# Patient Record
Sex: Male | Born: 1980
Health system: Southern US, Community
[De-identification: ages and names within clinical notes are randomized; demographics above are authoritative.]

## PROBLEM LIST (undated history)

## (undated) DIAGNOSIS — K219 Gastro-esophageal reflux disease without esophagitis: Secondary | ICD-10-CM

## (undated) DIAGNOSIS — IMO0001 Reserved for inherently not codable concepts without codable children: Secondary | ICD-10-CM

---

## 2005-04-04 ENCOUNTER — Emergency Department: Payer: Self-pay | Admitting: Emergency Medicine

## 2005-07-08 ENCOUNTER — Emergency Department: Payer: Self-pay | Admitting: Emergency Medicine

## 2006-03-06 ENCOUNTER — Emergency Department (HOSPITAL_COMMUNITY): Admission: EM | Admit: 2006-03-06 | Discharge: 2006-03-07 | Payer: Self-pay | Admitting: Emergency Medicine

## 2006-05-14 ENCOUNTER — Emergency Department (HOSPITAL_COMMUNITY): Admission: EM | Admit: 2006-05-14 | Discharge: 2006-05-15 | Payer: Self-pay | Admitting: Emergency Medicine

## 2009-09-23 ENCOUNTER — Emergency Department: Payer: Self-pay | Admitting: Emergency Medicine

## 2012-08-22 ENCOUNTER — Emergency Department: Payer: Self-pay | Admitting: Emergency Medicine

## 2012-08-22 LAB — URINALYSIS, COMPLETE
Bilirubin,UR: NEGATIVE
Blood: NEGATIVE
Glucose,UR: NEGATIVE mg/dL (ref 0–75)
Protein: 30
RBC,UR: 15 /HPF (ref 0–5)
Specific Gravity: 1.035 (ref 1.003–1.030)

## 2012-08-22 LAB — CBC
HCT: 47.4 % (ref 40.0–52.0)
MCV: 86 fL (ref 80–100)
Platelet: 152 10*3/uL (ref 150–440)
RBC: 5.53 10*6/uL (ref 4.40–5.90)

## 2012-08-22 LAB — COMPREHENSIVE METABOLIC PANEL
Alkaline Phosphatase: 69 U/L (ref 50–136)
BUN: 15 mg/dL (ref 7–18)
Calcium, Total: 9.6 mg/dL (ref 8.5–10.1)
Chloride: 104 mmol/L (ref 98–107)
Creatinine: 1.19 mg/dL (ref 0.60–1.30)
EGFR (African American): >60
Potassium: 4.6 mmol/L (ref 3.5–5.1)
SGPT (ALT): 17 U/L (ref 12–78)

## 2012-11-15 ENCOUNTER — Emergency Department: Payer: Self-pay | Admitting: Emergency Medicine

## 2012-11-15 LAB — COMPREHENSIVE METABOLIC PANEL
Albumin: 4.3 g/dL (ref 3.4–5.0)
Bilirubin,Total: 0.9 mg/dL (ref 0.2–1.0)
Calcium, Total: 9.6 mg/dL (ref 8.5–10.1)
Chloride: 98 mmol/L (ref 98–107)
Creatinine: 1.13 mg/dL (ref 0.60–1.30)
EGFR (African American): >60
Osmolality: 272 (ref 275–301)
Potassium: 3.5 mmol/L (ref 3.5–5.1)
Total Protein: 8.9 g/dL — ABNORMAL HIGH (ref 6.4–8.2)

## 2012-11-15 LAB — URINALYSIS, COMPLETE
Bilirubin,UR: NEGATIVE
Glucose,UR: NEGATIVE mg/dL (ref 0–75)
Ph: 6 (ref 4.5–8.0)
Protein: 100
Specific Gravity: 1.032 (ref 1.003–1.030)
WBC UR: 2 /HPF (ref 0–5)

## 2012-11-15 LAB — CBC
HCT: 47.8 % (ref 40.0–52.0)
HGB: 16 g/dL (ref 13.0–18.0)
MCH: 28.2 pg (ref 26.0–34.0)
WBC: 10 10*3/uL (ref 3.8–10.6)

## 2013-03-27 ENCOUNTER — Inpatient Hospital Stay: Payer: Self-pay | Admitting: Internal Medicine

## 2013-03-27 LAB — COMPREHENSIVE METABOLIC PANEL
Albumin: 4.1 g/dL (ref 3.4–5.0)
Alkaline Phosphatase: 58 U/L (ref 50–136)
Bilirubin,Total: 0.9 mg/dL (ref 0.2–1.0)
Chloride: 100 mmol/L (ref 98–107)
Co2: 30 mmol/L (ref 21–32)
EGFR (Non-African Amer.): >60
Total Protein: 8.4 g/dL — ABNORMAL HIGH (ref 6.4–8.2)

## 2013-03-27 LAB — CBC
HCT: 47.6 % (ref 40.0–52.0)
MCH: 28.8 pg (ref 26.0–34.0)
MCV: 84 fL (ref 80–100)
WBC: 9.4 10*3/uL (ref 3.8–10.6)

## 2013-03-27 LAB — URINALYSIS, COMPLETE
Bacteria: NONE SEEN
Glucose,UR: NEGATIVE mg/dL (ref 0–75)
Nitrite: NEGATIVE
Squamous Epithelial: NONE SEEN

## 2013-03-28 LAB — CBC WITH DIFFERENTIAL/PLATELET
Basophil #: 0.1 10*3/uL (ref 0.0–0.1)
Basophil %: 0.8 %
Eosinophil #: 0.1 10*3/uL (ref 0.0–0.7)
HGB: 14.8 g/dL (ref 13.0–18.0)
MCH: 28.4 pg (ref 26.0–34.0)
Monocyte #: 0.7 x10 3/mm (ref 0.2–1.0)
Neutrophil %: 55.8 %
Platelet: 117 10*3/uL — ABNORMAL LOW (ref 150–440)
RBC: 5.21 10*6/uL (ref 4.40–5.90)
RDW: 13.3 % (ref 11.5–14.5)
WBC: 6.6 10*3/uL (ref 3.8–10.6)

## 2013-03-28 LAB — BASIC METABOLIC PANEL
Chloride: 107 mmol/L (ref 98–107)
Osmolality: 276 (ref 275–301)

## 2013-03-28 LAB — PROTIME-INR: Prothrombin Time: 15.7 secs — ABNORMAL HIGH (ref 11.5–14.7)

## 2014-12-12 NOTE — Consult Note (Signed)
PATIENT NAME:  Eric Mcfarland, [name] MR#:  [id number] DATE OF BIRTH:  06/14/1981  DATE OF CONSULTATION:  03/28/2013  Dictated for Barnetta ChapelMartin Skulskie, MD  REFERRING PHYSICIAN:  Dr. Sherryll BurgerShah.  I appreciate consult from Dr. Sherryll BurgerShah for this 34 year old African American man admitted for nausea and vomiting for evaluation of the same. Evidently the patient was in his normal state of health, described as good until Tuesday, 2 days ago, when he developed some abdominal discomfort followed by several episodes of vomiting. He states late Tuesday night he vomited a moderate-to-large amount of dark red material. Otherwise, the emesis is yellowish-green. There were some red streaks in it yesterday states. He states has had some upper abdominal fullness  and a decreased appetite. States he had one episode similar to this one New Year's Eve, but associated this with his heavy alcohol use at the time, which he reports he has significantly decreased since then. States rare consumption only at present. I do note some mild thrombocytopenia this visit; this was not present in March of this year. States he has no history of this or liver disease in the past. Liver and spleen appear normal on CT. Denies history of acid reflux symptoms, NSAIDs, abdominal pain, problems swallowing, loss of appetite, melena, loss of weight, hematochezia, further GI complaints.   MEDICATIONS: None.   PAST MEDICAL HISTORY: None.   PAST SURGICAL HISTORY: None.   ALLERGIES: THE PATIENT IS ALLERGIC TO AMOXICILLIN.   SOCIAL HISTORY: Smokes 1 pack of cigarettes daily. Drinks alcohol on an occasional basis since the turn of the year this year, which prior to he does admit that he was a heavy alcohol consumer. Does admit to smoking marijuana 2 to 3 times a day. He states he does not use any other illicit substances. Currently lives with his girlfriend. No history of prior blood transfusion.   FAMILY HISTORY: Significant for hypertension, diabetes. Denies  family history of colorectal cancer, colon polyps, liver disease, ulcers.   REVIEW OF SYSTEMS: 10 systems reviewed. Significant only for fatigue and feeling generally weak. Denies other complaints, and otherwise the systems review is negative.   MOST RECENT LABS: Glucose 123, BUN 11, creatinine 0.98 sodium 138, potassium 3.8, GFR greater than 60, calcium 8.4, lipase 257, total protein 8.4, albumin 4.1, total bilirubin 0.9, ALP 58, AST 18 ALT 16.   WBC 6.6, hemoglobin 14.8, hematocrit 43.8, platelet count 117.   Red cells are normocytic and normochromic, with normal RDW.   CT with IV contrast only with no apparent bowel obstruction or perforation. Was not able to find the appendix. Otherwise not revealing for a significant for abdominal pathology.   PHYSICAL EXAMINATION: VITAL SIGNS: Most recent vital signs: Temperature 98.7, pulse 59, respiratory rate 20, blood pressure 124/80. SAO2 of 99%.  GENERAL: Well-appearing young man resting comfortably in bed, in no acute distress  HEENT: Normocephalic, atraumatic. Sclerae are clear. No redness, drainage, inflammation to the eyes or the nares. No mouth sores.  NECK: Supple. Flexible. No lymphadenopathy, JVD.  CHEST: Respirations eupneic. Lungs CTA.  CARDIOVASCULAR: S1, S2, RRR. No MRG. Peripheral pulses 2+. No appreciable edema.  GASTROINTESTINAL: Flat, soft abdomen. Bowel sounds x 4. Minimal bilateral upper quadrant tenderness. No guarding, rigidity, peritoneal signs, rebound tenderness, hepatosplenomegaly or masses, or other abnormalities appreciated.  SKIN: Warm, dry, pink. No erythema, lesion or rash. No ecchymosis.  NEUROLOGICAL: Alert, oriented x 3. Cranial nerves II-XII grossly intact. Speech clear. No facial droop.  EXTREMITIES: MAE well x 4. Strength five out of  five. Sensation intact.  PSYCH: Pleasant, calm, cooperative.   IMPRESSION AND PLAN: Abdominal discomfort, nausea, vomiting, history of likely hematemesis. Hemoglobin labs are  stable. Agree with PPI therapy. Will plan EGD for luminal evaluation this afternoon.  Discussed the indications, risks, and benefits with him, and he is agreeable. He has also had htis vomiting since he has been here. Due to his thrombocytopenia and the alcohol history we will also obtain PPI and INR prior to the  procedure. He has been counseled by the admitting team already regarding his substance use.  [facility] services were provided by Vevelyn Pat, MSN, NPC in collaboration with Barnetta Chapel, M.D., with whom I have discussed this patient in full.   Thank you for this consult.    ____________________________ Keturah Barre, NP chl:dm D: 03/28/2013 14:25:03 ET T: 03/28/2013 15:07:41 ET JOB#: [id number]  cc: Keturah Barre, NP, <Dictator> Eustaquio Maize Phinehas Grounds FNP ELECTRONICALLY SIGNED 04/19/2013 8:47

## 2014-12-12 NOTE — Consult Note (Signed)
Chief Complaint:  Subjective/Chief Complaint seen for nausea, vomiting,  epigastric pain.  nausea today, no emesis, continues with some epigastric pain.  tolerating small amounts of clears.   VITAL SIGNS/ANCILLARY NOTES: **Vital Signs.:   08-Aug-14 04:30  Vital Signs Type Routine  Temperature Temperature (F) 98.5  Celsius 36.9  Pulse Pulse 73  Respirations Respirations 20  Systolic BP Systolic BP 130  Diastolic BP (mmHg) Diastolic BP (mmHg) 85  Mean BP 100  Pulse Ox % Pulse Ox % 98  Pulse Ox Activity Level  At rest  Oxygen Delivery Room Air/ 21 %   Brief Assessment:  Cardiac Regular   Respiratory clear BS   Gastrointestinal details normal Nondistended  Bowel sounds normal  No rebound tenderness  tender to palpation, mild voluntary guarding, positive epigastric pain   Assessment/Plan:  Assessment/Plan:  Assessment 1) epigastric pain, n/v-EGD showing DU, multiple GU and gastritis. Erosive esophagitis.   2) history of etoh abuse and substance abuse (marijuana)   Plan 1) continue bid ppi, clears today.  awaiting h. pylori serology.   Dr Mechele CollinElliott covering over the weekend.   Electronic Signatures: Barnetta ChapelSkulskie, Martin (MD)  (Signed 08-Aug-14 12:40)  Authored: Chief Complaint, VITAL SIGNS/ANCILLARY NOTES, Brief Assessment, Assessment/Plan   Last Updated: 08-Aug-14 12:40 by Barnetta ChapelSkulskie, Martin (MD)

## 2014-12-12 NOTE — Consult Note (Signed)
Brief Consult Note: Diagnosis: abdominal pain, vomiting.   Patient was seen by consultant.   Consult note dictated.   Discussed with Attending MD.   Comments: Appreciate consult for 34 y/o [place] man admitted with NV for evaluation of the same. Evidentally patient was in his normal states of health, described as good, until Tuesday 2d ago, when he developed some abdominal discomfort followed by several episodes of vomiting. States that late Tuesday nigth he vomited a moderate to large amount of dark red material. Otherwise the emesis has been yellowy-green. There were some red streaks in it yesterday. States he has some upper abdominal soreness and decreased appetite. States he had one episode similar to this over New Years Eve- but associated this with his heavy etoh use at the time, which reports he has significantly decreased since then. States rare consumption only now. Do note some mild thrombocytopenia this visit- that was not present in March of this year. States he has no history of this or liver disease in past. Liver and spleen appear normal on CT.  Denies history of acid reflux symptoms, NSAIDs, abodminal pain, problems swallowing, loss of appetite, melena, hematochezia, and further GI complaints. Impression and plan: Abdominal discomfort, NV, history of likely hematemesis. HGB/labs stable. Agree with PPI therapy. Will plan EGD for luminal evaluation later this afternoon. Discussed the indications/risks/benefits with him and he is agreeable. Due to his thrombocytopenia and etoh history, will also obtain PT/INR prior to procedure. He has been counselled by admitting team regarding marijuana use.  Electronic Signatures: Vevelyn PatLondon, Andersen Iorio H (NP)  (Signed 07-Aug-14 14:14)  Authored: Brief Consult Note   Last Updated: 07-Aug-14 14:14 by Keturah BarreLondon, Hinda Lindor H (NP)

## 2014-12-12 NOTE — Consult Note (Signed)
Chief Complaint:  Subjective/Chief Complaint Please see endoscopy report.  2 gastric ulcers in the antrum, moderate sized duodenal ulcer in the posterior bulb with duodenitis, Gastritis in the cardia, friable, friable esophagitis, likely erosive from multiple bouts of emesis.  Minimal oozing in the cardia, distal esophagus.  No other bleeding.   Continue ppi as you are avoid nsaids, awaiting h. pylori serology, continue npo for now until tomorrow.   following.   VITAL SIGNS/ANCILLARY NOTES: **Vital Signs.:   07-Aug-14 14:09  Vital Signs Type Routine  Temperature Temperature (F) 98.7  Celsius 37  Temperature Source oral  Respirations Respirations 20  Systolic BP Systolic BP 124  Diastolic BP (mmHg) Diastolic BP (mmHg) 80  Mean BP 94  Pulse Ox % Pulse Ox % 99  Pulse Ox Activity Level  At rest  Oxygen Delivery Room Air/ 21 %   Electronic Signatures: Barnetta ChapelSkulskie, Tumeka Chimenti (MD)  (Signed 07-Aug-14 15:10)  Authored: Chief Complaint, VITAL SIGNS/ANCILLARY NOTES   Last Updated: 07-Aug-14 15:10 by Barnetta ChapelSkulskie, Andra Matsuo (MD)

## 2014-12-12 NOTE — Consult Note (Signed)
CC: abd pain.  Pt tol diet OK but wants more food.  VSS afebrile, Plt ct 117.  EGD report reviewed, ulcers and gastritis.  i explained to him that this would be a problem to him as long as he drank alcohol and smoked.  Also told him it takes 8 or more weeks to heal these and not to stop mediciation if he began to feel better after discharge before he finished the full course of treatment.  Electronic Signatures: Scot JunElliott, Kingslee Mairena T (MD)  (Signed on 09-Aug-14 11:49)  Authored  Last Updated: 09-Aug-14 11:49 by Scot JunElliott, Denzel Etienne T (MD)

## 2014-12-12 NOTE — H&P (Signed)
PATIENT NAME:  Eric Mcfarland, Cher L MR#:  [id number] DATE OF BIRTH:  11-Aug-1981  DATE OF ADMISSION:  03/27/2013  PRIMARY CARE PHYSICIAN: None.   REFERRING PHYSICIAN: Dr. Dorothea GlassmanPaul Malinda.   CHIEF COMPLAINT: Nausea, vomiting.   HISTORY OF PRESENT ILLNESS: Mr. Eric Mcfarland is a 34 year old white male with history of tobacco and marijuana use. Had an episode of bloody vomiting about 4 days back. For the last 2 days, has been having multiple episodes of nausea and vomiting with occasional bloody streaks. Concerning this, was unable to keep down any food. Had decreased urine output. Denies having any sick contacts. Denies having eaten any food from outside. Denies eating any leftover food. Concerning this, came to the Emergency Department. Workup in the Emergency Department, labs and vitals are completely within normal limits. Attempted to obtain CT abdomen and pelvis with oral contrast; however, the patient was unable to keep down any contrast. The patient had CT abdomen and pelvis with IV contrast which showed multiple fluid-filled loops of small bowel in the pelvis, making visualization of the appendix difficult.   PAST MEDICAL HISTORY: None.   PAST SURGICAL HISTORY: None.   SOCIAL HISTORY: Continues to smoke 1 pack a day. Drinks alcohol occasionally. Uses marijuana 2 to 3 times a day.   SOCIAL HISTORY: Lives with his girlfriend.   FAMILY HISTORY: Hypertension, diabetes mellitus.   REVIEW OF SYSTEMS:  CONSTITUTIONAL: Generalized weakness, fatigue.  EYES: No change in vision.  ENT: No change hearing.   RESPIRATORY: No cough, shortness of breath.  CARDIOVASCULAR: No chest pain, palpitations. No pedal edema.  GASTROINTESTINAL: Has nausea, vomiting, abdominal pain.  GENITOURINARY: No dysuria. The patient states has decreased urine output.  SKIN: No rash or lesions.  HEMATOLOGIC: No easy bruising or bleeding.  NEUROLOGIC: No weakness or numbness in any part of the body.   ASSESSMENT AND PLAN: Mr.  Eric Mcfarland is a 34 year old male who comes to the Emergency Department with multiple episodes of nausea and vomiting.  1. Nausea and vomiting: The patient states had 1 episode of bloody vomitus. The patient's hemoglobin is stable. Admit the patient to a medical bed. Continue with intravenous fluids. Keep the patient n.p.o. Keep the patient on Protonix 40 mg b.i.d. If the patient cannot tolerate, then will change the medications to intravenous. The cause of the nausea and vomiting is food poisoning versus marijuana use.  2. Marijuana use: Counseled extensively the patient. The patient expressed understanding.  3. Tobacco use: Counseled with the patient for 5 minutes.  4. Gastrointestinal bleed: The patient did not have any episodes in the last 2 days. Keep the patient on Protonix b.i.d. Will consult gastroenterology in the morning.  5. Keep the patient on deep vein thrombosis prophylaxis with sequential compression devices.  TIme spent 45 min    ____________________________ Susa GriffinsPadmaja Vahan Wadsworth, MD pv:gb D: 03/27/2013 23:59:22 ET T: 03/28/2013 00:48:45 ET JOB#: [id number]  cc: Susa GriffinsPadmaja Boss Danielsen, MD, <Dictator> Susa GriffinsPADMAJA Willoughby Doell MD ELECTRONICALLY SIGNED 04/24/2013 21:50

## 2014-12-12 NOTE — Discharge Summary (Signed)
PATIENT NAME:  Eric Mcfarland, [name] MR#:  [id number] DATE OF BIRTH:  Jan 19, 1981  DATE OF ADMISSION:  03/27/2013 DATE OF DISCHARGE:  03/31/2013  PRIMARY CARE PHYSICIAN: None  GASTROENTEROLOGIST: Dr. Marva PandaSkulskie   DISCHARGE DIAGNOSES: 1.  Nausea, vomiting and epigastric pain, found to have  gastric and duodenal ulcers.  2.  Helicobacter pylori positive.  3.  Hematemesis and gastrointestinal  bleed.  4.  Marijuana use.  5.  Tobacco abuse.   MEDICATIONS ON DISCHARGE: Include Percocet 325/5, 1 tablet every 6 hours as needed for pain, omeprazole 20 mg twice a day, Flagyl 250 mg 4 times a day for 14 days, tetracycline 250 mg 4 times a day for 14 days.   DIET: Mechanical soft.    ACTIVITY: As tolerated.   FOLLOWUP: With Dr. Marva PandaSkulskie in 2 weeks for appointment, in 1 to 2 weeks with your medical doctor.   REASON FOR ADMISSION: The patient was admitted 03/27/2013, discharged 03/31/2013. The patient came in with nausea, vomiting, hematemesis.   LABORATORY AND RADIOLOGICAL DATA: During the hospital course included:  A urinalysis that showed trace leukocyte esterase, 30 mg/dL of protein, 2+ ketones.  Lipase 257, glucose 170, BUN 15, creatinine 1.25, sodium 135, potassium 3.5, chloride 100, CO2 of 30, calcium 9.4. Liver function tests: Total protein slightly elevated at 8.4. Other liver function tests normal range. White blood cell count 9.4, H and H 16.3 and 47.6, platelet count of 146. CT scan of the abdomen and pelvis with contrast showed appendix not demonstrated, upper abdominal structures unremarkable, multiple fluid-filled loops of small bowel.  Hemoglobin 14.8 upon discharge. Helicobacter antibody IgG elevated at 5.3, IgA negative, IgM negative.   HOSPITAL COURSE PER PROBLEM LIST:  1.  For the patient's nausea and vomiting epigastric pain, the  patient did have an endoscopy by Dr. Marva PandaSkulskie on 03/28/2013 that showed grade C erosive esophagitis, gastric ulcers, erosive gastritis, duodenal ulcer with  a clean base. The patient's diet was advanced very slowly. On 08/10, the patient was having no abdominal pain, was tolerating diet and was discharged home.  2.  He was found to be H. pylori positive.  The patient will be treated with PPI, Flagyl and tetracycline  3.  Hematemesis and gastrointestinal bleed.  Hemoglobin remained stable.  4.  Marijuana use. Advised to stop this.  5.  Tobacco abuse. Smoking cessation counseling done 3 minutes during the hospitalization.   TIME SPENT ON DISCHARGE: 35 minutes.   ____________________________ Herschell Dimesichard J. Renae GlossWieting, MD rjw:cb D: 03/31/2013 13:17:11 ET T: 03/31/2013 21:40:12 ET JOB#: [id number]  cc: Herschell Dimesichard J. Renae GlossWieting, MD, <Dictator> Christena DeemMartin U. Skulskie, MD No primary care physician  Salley ScarletICHARD J Teffany Blaszczyk MD ELECTRONICALLY SIGNED 04/05/2013 16:46

## 2014-12-12 NOTE — Consult Note (Signed)
Chief Complaint:  Subjective/Chief Complaint Patient seen and examined, please see full GI consult.  Eric Mcfarland admitted last night with n/v and reported hematemesis for the past several days.  Currently feeling some better, continuing with epigastric pain.  Denies nsaids, uses marijuana daily for many years.  Some thrombocytopenia noted.  No black stools.  History of heavy etoh use in the past.  Hemodynamically stable, normal hgb, will check PT. Will proceed with egd this afternoon.  I have discussed the risks benefits and complications of egd to include not limited to bleeding infection perforation and sedation and he wishes to proceed.   VITAL SIGNS/ANCILLARY NOTES: **Vital Signs.:   07-Aug-14 04:34  Vital Signs Type Routine  Temperature Temperature (F) 97.9  Celsius 36.6  Temperature Source oral  Respirations Respirations 20  Systolic BP Systolic BP 213  Diastolic BP (mmHg) Diastolic BP (mmHg) 71  Mean BP 85  Pulse Ox % Pulse Ox % 97  Pulse Ox Activity Level  At rest  Oxygen Delivery Room Air/ 21 %   Brief Assessment:  Cardiac Regular   Respiratory clear BS   Gastrointestinal details normal Soft  Nondistended  Bowel sounds normal  No rebound tenderness  mild epigastric abdominal pain to palpation andless so generalized discomfort.   Lab Results: Routine Chem:  07-Aug-14 06:15   Glucose, Serum  123  BUN 11  Creatinine (comp) 0.98  Sodium, Serum 138  Potassium, Serum 3.8  Chloride, Serum 107  CO2, Serum 27  Calcium (Total), Serum  8.4  Anion Gap  4  Osmolality (calc) 276  eGFR (Non-African American) >60 (eGFR values <3mL/min/1.73 m2 may be an indication of chronic kidney disease (CKD). Calculated eGFR is useful in patients with stable renal function. The eGFR calculation will not be reliable in acutely ill patients when serum creatinine is changing rapidly. It is not useful in  patients on dialysis. The eGFR calculation may not be applicable to patients at the low and  high extremes of body sizes, pregnant women, and vegetarians.)  Routine Hem:  07-Aug-14 06:15   WBC (CBC) 6.6  RBC (CBC) 5.21  Hemoglobin (CBC) 14.8  Hematocrit (CBC) 43.8  Platelet Count (CBC)  117  MCV 84  MCH 28.4  MCHC 33.8  RDW 13.3  Neutrophil % 55.8  Lymphocyte % 31.3  Monocyte % 11.0  Eosinophil % 1.1  Basophil % 0.8  Neutrophil # 3.7  Lymphocyte # 2.1  Monocyte # 0.7  Eosinophil # 0.1  Basophil # 0.1 (Result(s) reported on 28 Mar 2013 at 06:46AM.)   Radiology Results: CT:    06-Aug-14 22:00, CT Abdomen and Pelvis With Contrast  CT Abdomen and Pelvis With Contrast   REASON FOR EXAM:    (1) intractgable vomiting x 2 days; (2) vomiting x 2   days no diarrhea  COMMENTS:       PROCEDURE: CT  - CT ABDOMEN / PELVIS  W  - Mar 27 2013 10:00PM     RESULT: CT of the abdomen and pelvis is performed with 100 mL of   Isovue-300 iodinated intravenous contrast without oral contrast.   Comparison is made to images from a previous exam dated 08/23/2012.    The lung bases appear clear. The liver and spleen appear unremarkable.   There is a moderate amount of fluid in the stomach, especially in the   fundus. The aorta is normal in caliber. There is virtually no mesenteric   fat. Given the lack of oral contrast and mesenteric fat,  the appendix is   not identified. There is a moderate amount of urine in the urinary     bladder. No gallstones are evident. The kidneys are unremarkable. The   adrenal glands are normal. No adenopathy is evident. There is scattered   air in fecal material in the colon to the rectum. No definite bowel   obstruction is evident. There is no pneumoperitoneum or ascites. No   abscess is appreciated although given the lack of opacification of the   bowel it would be difficult to completely exclude small fluid collections.    IMPRESSION:   1. The appendix cannot be demonstrated.  2. The upper abdominal structures appear unremarkable.  3. No evidence of  bowel obstruction or perforation. There are multiple   fluid-filled loops of small bowel in the pelvis making visualization of   the appendix even more difficult.    Dictation Site: 6    Verified By: Sundra Aland, M.D., MD   Assessment/Plan:  Assessment/Plan:  Assessment 1) nausea vomiting-improved since admission 2) reported hematemesis-not recurrent since admission.  hemodynamically stable, mild abdominal pain, no black stool or melena. 3) history of etoh abuse, states none since the first of the year.  note thrombocytopenia.  4) ongoing marijuana use.   Plan 1) egd today.  I have discussed the risks benefits and complicatiosn of eegfd to include not limited to bleeding infection  perforation and sedation and he wishes to proceed.   Electronic Signatures: Loistine Simas (MD)  (Signed 07-Aug-14 14:04)  Authored: Chief Complaint, VITAL SIGNS/ANCILLARY NOTES, Brief Assessment, Lab Results, Radiology Results, Assessment/Plan   Last Updated: 07-Aug-14 14:04 by Loistine Simas (MD)

## 2015-02-17 ENCOUNTER — Emergency Department
Admission: EM | Admit: 2015-02-17 | Discharge: 2015-02-17 | Disposition: A | Discharge status: Home / Self Care | Payer: Self-pay | Attending: Emergency Medicine | Admitting: Emergency Medicine

## 2015-02-17 ENCOUNTER — Encounter: Payer: Self-pay | Admitting: Emergency Medicine

## 2015-02-17 DIAGNOSIS — Z880 Allergy status to penicillin: Secondary | ICD-10-CM | POA: Insufficient documentation

## 2015-02-17 DIAGNOSIS — Z720 Tobacco use: Secondary | ICD-10-CM | POA: Insufficient documentation

## 2015-02-17 DIAGNOSIS — Y998 Other external cause status: Secondary | ICD-10-CM | POA: Insufficient documentation

## 2015-02-17 DIAGNOSIS — S51811A Laceration without foreign body of right forearm, initial encounter: Secondary | ICD-10-CM | POA: Insufficient documentation

## 2015-02-17 DIAGNOSIS — Z23 Encounter for immunization: Secondary | ICD-10-CM | POA: Insufficient documentation

## 2015-02-17 DIAGNOSIS — Y9289 Other specified places as the place of occurrence of the external cause: Secondary | ICD-10-CM | POA: Insufficient documentation

## 2015-02-17 DIAGNOSIS — W228XXA Striking against or struck by other objects, initial encounter: Secondary | ICD-10-CM | POA: Insufficient documentation

## 2015-02-17 DIAGNOSIS — T07XXXA Unspecified multiple injuries, initial encounter: Secondary | ICD-10-CM

## 2015-02-17 DIAGNOSIS — Y9389 Activity, other specified: Secondary | ICD-10-CM | POA: Insufficient documentation

## 2015-02-17 DIAGNOSIS — S5011XA Contusion of right forearm, initial encounter: Secondary | ICD-10-CM | POA: Insufficient documentation

## 2015-02-17 HISTORY — DX: Gastro-esophageal reflux disease without esophagitis: K21.9

## 2015-02-17 HISTORY — DX: Reserved for inherently not codable concepts without codable children: IMO0001

## 2015-02-17 MED ORDER — CLINDAMYCIN HCL 150 MG PO CAPS
150.0000 mg | ORAL_CAPSULE | Freq: Once | ORAL | Status: AC
  Administered 2015-02-17: 150 mg via ORAL

## 2015-02-17 MED ORDER — TRAMADOL HCL 50 MG PO TABS: 50.0000 mg | ORAL_TABLET | Freq: Four times a day (QID) | ORAL | Status: DC | PRN

## 2015-02-17 MED ORDER — TETANUS-DIPHTH-ACELL PERTUSSIS 5-2.5-18.5 LF-MCG/0.5 IM SUSP
INTRAMUSCULAR | Status: AC
  Administered 2015-02-17: 0.5 mL via INTRAMUSCULAR
  Filled 2015-02-17: qty 0.5

## 2015-02-17 MED ORDER — TETANUS-DIPHTH-ACELL PERTUSSIS 5-2.5-18.5 LF-MCG/0.5 IM SUSP
0.5000 mL | Freq: Once | INTRAMUSCULAR | Status: AC
  Administered 2015-02-17: 0.5 mL via INTRAMUSCULAR

## 2015-02-17 MED ORDER — LIDOCAINE HCL (PF) 1 % IJ SOLN
INTRAMUSCULAR | Status: AC
  Filled 2015-02-17: qty 5

## 2015-02-17 MED ORDER — TRAMADOL HCL 50 MG PO TABS
50.0000 mg | ORAL_TABLET | Freq: Once | ORAL | Status: AC
  Administered 2015-02-17: 50 mg via ORAL

## 2015-02-17 MED ORDER — BACITRACIN ZINC 500 UNIT/GM EX OINT
TOPICAL_OINTMENT | CUTANEOUS | Status: AC
  Administered 2015-02-17: 1
  Filled 2015-02-17: qty 2.7

## 2015-02-17 MED ORDER — TRAMADOL HCL 50 MG PO TABS
ORAL_TABLET | ORAL | Status: AC
  Administered 2015-02-17: 50 mg via ORAL
  Filled 2015-02-17: qty 1

## 2015-02-17 MED ORDER — CLINDAMYCIN HCL 150 MG PO CAPS
ORAL_CAPSULE | ORAL | Status: AC
  Administered 2015-02-17: 150 mg via ORAL
  Filled 2015-02-17: qty 1

## 2015-02-17 MED ORDER — CLINDAMYCIN HCL 150 MG PO CAPS: 150.0000 mg | ORAL_CAPSULE | Freq: Three times a day (TID) | ORAL | Status: DC

## 2015-02-17 NOTE — ED Notes (Signed)
Patient with no complaints at this time. Respirations even and unlabored. Skin warm/dry. Discharge instructions reviewed with patient at this time. Patient given opportunity to voice concerns/ask questions. Patient discharged at this time and left Emergency Department with steady gait.   
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
## 2015-02-23 ENCOUNTER — Encounter: Payer: Self-pay | Admitting: *Deleted

## 2015-02-23 ENCOUNTER — Emergency Department
Admission: EM | Admit: 2015-02-23 | Discharge: 2015-02-23 | Disposition: A | Discharge status: Home / Self Care | Payer: Self-pay | Attending: Emergency Medicine | Admitting: Emergency Medicine

## 2015-02-23 DIAGNOSIS — Z880 Allergy status to penicillin: Secondary | ICD-10-CM | POA: Insufficient documentation

## 2015-02-23 DIAGNOSIS — Z720 Tobacco use: Secondary | ICD-10-CM | POA: Insufficient documentation

## 2015-02-23 DIAGNOSIS — Z4801 Encounter for change or removal of surgical wound dressing: Secondary | ICD-10-CM | POA: Insufficient documentation

## 2015-02-23 DIAGNOSIS — IMO0002 Reserved for concepts with insufficient information to code with codable children: Secondary | ICD-10-CM

## 2015-02-23 MED ORDER — CLINDAMYCIN HCL 300 MG PO CAPS: 300.0000 mg | ORAL_CAPSULE | Freq: Three times a day (TID) | ORAL | Status: DC

## 2015-02-23 MED ORDER — TRAMADOL HCL 50 MG PO TABS: 50.0000 mg | ORAL_TABLET | Freq: Four times a day (QID) | ORAL | Status: AC | PRN

## 2015-02-23 NOTE — ED Notes (Signed)
Here for recheck of stapled area on right forearm

## 2015-02-23 NOTE — ED Notes (Signed)
Pt's wound dressed with gauze prior to discharge

## 2015-02-23 NOTE — ED Provider Notes (Signed)
[facility] Emergency Department Provider Note  Time seen: 7:22 PM  I have reviewed the triage vital signs and the nursing notes.   HISTORY  Chief Complaint Wound Check    HPI Eric Mcfarland is a 34 y.o. male with no past medical history presents the emergency department for reevaluation of wound to his right arm. According to the patient he was seen here on 02/17/15 for lacerations to the right arm. It was repaired, and the patient was placed on clindamycin and Ultram as needed. He has staples which need to be removed in approximately 4 days. He states he is having trouble working, so he was told to have an evaluation by his boss. Patient notes occasional bleeding from the site, denies any pus, fever, or redness. Describes his pain as moderate, dull/aching.     Past Medical History  Diagnosis Date  . Reflux     There are no active problems to display for this patient.   History reviewed. No pertinent past surgical history.  Current Outpatient Rx  Name  Route  Sig  Dispense  Refill  . clindamycin (CLEOCIN) 150 MG capsule   Oral   Take 1 capsule (150 mg total) by mouth 3 (three) times daily.   10 capsule   0   . traMADol (ULTRAM) 50 MG tablet   Oral   Take 1 tablet (50 mg total) by mouth every 6 (six) hours as needed.   20 tablet   0     Allergies Penicillins  No family history on file.  Social History History  Substance Use Topics  . Smoking status: Current Every Day Smoker -- 1.00 packs/day    Types: Cigarettes  . Smokeless tobacco: Not on file  . Alcohol Use: Yes    Review of Systems Constitutional: Negative for fever. Cardiovascular: Negative for chest pain. Respiratory: Negative for shortness of breath. Gastrointestinal: Negative for abdominal pain Skin: Laceration to the right arm, repaired with staples.  10-point ROS otherwise negative.  ____________________________________________   PHYSICAL EXAM:  VITAL  SIGNS: ED Triage Vitals  Enc Vitals Group     BP 02/23/15 1809 139/67 mmHg     Pulse Rate 02/23/15 1809 105     Resp 02/23/15 1809 20     Temp 02/23/15 1809 98.3 F (36.8 C)     Temp Source 02/23/15 1809 Oral     SpO2 02/23/15 1809 97 %     Weight 02/23/15 1809 156 lb (70.761 kg)     Height 02/23/15 1809 5\' 6"  (1.676 m)     Head Cir --      Peak Flow --      Pain Score 02/23/15 1810 6     Pain Loc --      Pain Edu? --      Excl. in GC? --     Constitutional: Alert and oriented. Well appearing and in no distress. ENT   Head: Normocephalic and atraumatic.   Mouth/Throat: Mucous membranes are moist. Cardiovascular: Normal rate, regular rhythm. No murmur Respiratory: Normal respiratory effort without tachypnea nor retractions. Breath sounds are clear and equal bilaterally.  Gastrointestinal: Soft and nontender. No distention.  Musculoskeletal: Lacerations to the right arm. Staples have been used for repair. Well appearing, no infection, no erythema, no pus. No bleeding. Neurologic:  Normal speech and language. No gross focal neurologic deficits  Skin:  Skin is warm, dry Psychiatric: Mood and affect are normal. Speech and behavior are normal.  ____________________________________________  INITIAL IMPRESSION / ASSESSMENT AND PLAN / ED COURSE  Pertinent labs & imaging results that were available during my care of the patient were reviewed by me and considered in my medical decision making (see chart for details).  Patient with well-appearing lacerations to the right upper extremity status post staple repair. We will provide more Ultram for the patient, and extend his clindamycin until he can have his staples removed in 4 days. Patient agreeable to plan. We'll discharge the patient home at this time.  ____________________________________________   FINAL CLINICAL IMPRESSION(S) / ED DIAGNOSES  Wound evaluation   Minna Antis, MD 02/23/15 1925

## 2015-02-23 NOTE — Discharge Instructions (Signed)
Laceration Care, Adult A laceration is a cut that goes through all layers of the skin. The cut goes into the tissue beneath the skin. HOME CARE For stitches (sutures) or staples:  Keep the cut clean and dry.  If you have a bandage (dressing), change it at least once a day. Change the bandage if it gets wet or dirty, or as told by your doctor.  Wash the cut with soap and water 2 times a day. Rinse the cut with water. Pat it dry with a clean towel.  Put a thin layer of medicated cream on the cut as told by your doctor.  You may shower after the first 24 hours. Do not soak the cut in water until the stitches are removed.  Only take medicines as told by your doctor.  Have your stitches or staples removed as told by your doctor. For skin adhesive strips:  Keep the cut clean and dry.  Do not get the strips wet. You may take a bath, but be careful to keep the cut dry.  If the cut gets wet, pat it dry with a clean towel.  The strips will fall off on their own. Do not remove the strips that are still stuck to the cut. For wound glue:  You may shower or take baths. Do not soak or scrub the cut. Do not swim. Avoid heavy sweating until the glue falls off on its own. After a shower or bath, pat the cut dry with a clean towel.  Do not put medicine on your cut until the glue falls off.  If you have a bandage, do not put tape over the glue.  Avoid lots of sunlight or tanning lamps until the glue falls off. Put sunscreen on the cut for the first year to reduce your scar.  The glue will fall off on its own. Do not pick at the glue. You may need a tetanus shot if:  You cannot remember when you had your last tetanus shot.  You have never had a tetanus shot. If you need a tetanus shot and you choose not to have one, you may get tetanus. Sickness from tetanus can be serious. GET HELP RIGHT AWAY IF:   Your pain does not get better with medicine.  Your arm, hand, leg, or foot loses feeling  (numbness) or changes color.  Your cut is bleeding.  Your joint feels weak, or you cannot use your joint.  You have painful lumps on your body.  Your cut is red, puffy (swollen), or painful.  You have a red line on the skin near the cut.  You have yellowish-white fluid (pus) coming from the cut.  You have a fever.  You have a bad smell coming from the cut or bandage.  Your cut breaks open before or after stitches are removed.  You notice something coming out of the cut, such as wood or glass.  You cannot move a finger or toe. MAKE SURE YOU:   Understand these instructions.  Will watch your condition.  Will get help right away if you are not doing well or get worse. Document Released: 01/25/2008 Document Revised: 10/31/2011 Document Reviewed: 02/01/2011 [facility] Patient Information 2015 [place], [place]. This information is not intended to replace advice given to you by your health care provider. Make sure you discuss any questions you have with your health care provider.    Please return on 02/27/15 for staple removal. Continue your antibiotics and pain medication as needed, as  prescribed. Return sooner for any redness, pus, or fever.

## 2015-03-02 ENCOUNTER — Encounter: Payer: Self-pay | Admitting: Emergency Medicine

## 2015-03-02 ENCOUNTER — Emergency Department
Admission: EM | Admit: 2015-03-02 | Discharge: 2015-03-02 | Disposition: A | Discharge status: Home / Self Care | Payer: Self-pay | Attending: Emergency Medicine | Admitting: Emergency Medicine

## 2015-03-02 DIAGNOSIS — Z880 Allergy status to penicillin: Secondary | ICD-10-CM | POA: Insufficient documentation

## 2015-03-02 DIAGNOSIS — Z792 Long term (current) use of antibiotics: Secondary | ICD-10-CM | POA: Insufficient documentation

## 2015-03-02 DIAGNOSIS — Z4802 Encounter for removal of sutures: Secondary | ICD-10-CM | POA: Insufficient documentation

## 2015-03-02 DIAGNOSIS — Z720 Tobacco use: Secondary | ICD-10-CM | POA: Insufficient documentation

## 2015-03-02 NOTE — ED Notes (Signed)
22 staples removed

## 2015-03-02 NOTE — ED Provider Notes (Signed)
[facility] Emergency Department Provider Note  ____________________________________________  Time seen: Approximately 1:32 PM  I have reviewed the triage vital signs and the nursing notes.   HISTORY  Chief Complaint Suture / Staple Removal    HPI Eric Mcfarland is a 34 y.o. male Presents to the emergency room for evaluation of staples and have them removed. Denies any complaints at this time. Patient had 23 staples placed in on June 28.   Past Medical History  Diagnosis Date  . Reflux     There are no active problems to display for this patient.   History reviewed. No pertinent past surgical history.  Current Outpatient Rx  Name  Route  Sig  Dispense  Refill  . clindamycin (CLEOCIN) 300 MG capsule   Oral   Take 1 capsule (300 mg total) by mouth 3 (three) times daily.   12 capsule   0   . traMADol (ULTRAM) 50 MG tablet   Oral   Take 1 tablet (50 mg total) by mouth every 6 (six) hours as needed.   10 tablet   0     Allergies Penicillins  History reviewed. No pertinent family history.  Social History History  Substance Use Topics  . Smoking status: Current Every Day Smoker -- 1.00 packs/day    Types: Cigarettes  . Smokeless tobacco: Not on file  . Alcohol Use: Yes    Review of Systems Constitutional: No fever/chills Eyes: No visual changes. ENT: No sore throat. Cardiovascular: Denies chest pain. Respiratory: Denies shortness of breath. Gastrointestinal: No abdominal pain.  No nausea, no vomiting.  No diarrhea.  No constipation. Genitourinary: Negative for dysuria. Musculoskeletal: Negative for back pain. Skin: Positive for staples Neurological: Negative for headaches, focal weakness or numbness.  10-point ROS otherwise negative.  ____________________________________________   PHYSICAL EXAM:  VITAL SIGNS: ED Triage Vitals  Enc Vitals Group     BP 03/02/15 1323 112/54 mmHg     Pulse Rate 03/02/15 1323 64   Resp 03/02/15 1323 18     Temp 03/02/15 1323 98.2 F (36.8 C)     Temp Source 03/02/15 1323 Oral     SpO2 03/02/15 1323 100 %     Weight --      Height --      Head Cir --      Peak Flow --      Pain Score --      Pain Loc --      Pain Edu? --      Excl. in GC? --     Constitutional: Alert and oriented. Well appearing and in no acute distress. Eyes: Conjunctivae are normal. PERRL. EOMI. Head: Atraumatic. Nose: No congestion/rhinnorhea. Mouth/Throat: Mucous membranes are moist.  Oropharynx non-erythematous. Neck: No stridor.   Cardiovascular: Normal rate, regular rhythm. Grossly normal heart sounds.  Good peripheral circulation. Respiratory: Normal respiratory effort.  No retractions. Lungs CTAB. Gastrointestinal: Soft and nontender. No distention. No abdominal bruits. No CVA tenderness. Musculoskeletal: No lower extremity tenderness nor edema.  No joint effusions. Neurologic:  Normal speech and language. No gross focal neurologic deficits are appreciated. Speech is normal. No gait instability. Skin:  Skin is warm, dry and intact. No rash noted.Staples intact with well-healed laceration. Psychiatric: Mood and affect are normal. Speech and behavior are normal.  ____________________________________________   LABS (all labs ordered are listed, but only abnormal results are displayed)  Labs Reviewed - No data to display   PROCEDURES  Procedure(s) performed: None  Critical  Care performed: No  ____________________________________________   INITIAL IMPRESSION / ASSESSMENT AND PLAN / ED COURSE  Pertinent labs & imaging results that were available during my care of the patient were reviewed by me and considered in my medical decision making (see chart for details).  Staple removal. Patient voices no complaints at this time. 23 staples intact without erythema redness or drainage noted. She voices no other emergency medical complaints at this time and will return to the ER with  any worsening symptomology. ____________________________________________   FINAL CLINICAL IMPRESSION(S) / ED DIAGNOSES  Final diagnoses:  Encounter for staple removal      Evangeline Dakin, PA-C 03/02/15 1342  Emily Filbert, MD 03/02/15 1515

## 2015-03-02 NOTE — ED Notes (Signed)
Needs sutures removed  

## 2015-03-02 NOTE — Discharge Instructions (Signed)

## 2016-01-24 ENCOUNTER — Encounter: Payer: Self-pay | Admitting: Emergency Medicine

## 2016-01-24 DIAGNOSIS — F17210 Nicotine dependence, cigarettes, uncomplicated: Secondary | ICD-10-CM | POA: Insufficient documentation

## 2016-01-24 DIAGNOSIS — F1290 Cannabis use, unspecified, uncomplicated: Secondary | ICD-10-CM | POA: Insufficient documentation

## 2016-01-24 DIAGNOSIS — G44209 Tension-type headache, unspecified, not intractable: Secondary | ICD-10-CM | POA: Insufficient documentation

## 2016-01-24 NOTE — ED Notes (Addendum)
Pt c/o pain across his forehead that radiates across the top of his head and down the back; started 4 days ago and has been mostly constant; eases enough for him to sleep but then it returns; denies injury; denies fever; denies sensitivity to lights and sounds; talking in complete coherent sentences; OTC meds not helping pain

## 2016-01-25 ENCOUNTER — Emergency Department
Admission: EM | Admit: 2016-01-25 | Discharge: 2016-01-25 | Disposition: A | Discharge status: Home / Self Care | Payer: Self-pay | Attending: Emergency Medicine | Admitting: Emergency Medicine

## 2016-01-25 DIAGNOSIS — G44209 Tension-type headache, unspecified, not intractable: Secondary | ICD-10-CM

## 2016-01-25 HISTORY — DX: Gastro-esophageal reflux disease without esophagitis: K21.9

## 2016-01-25 MED ORDER — DIPHENHYDRAMINE HCL 50 MG/ML IJ SOLN
25.0000 mg | Freq: Once | INTRAMUSCULAR | Status: AC
  Administered 2016-01-25: 25 mg via INTRAMUSCULAR
  Filled 2016-01-25: qty 1

## 2016-01-25 MED ORDER — PROCHLORPERAZINE EDISYLATE 5 MG/ML IJ SOLN
5.0000 mg | Freq: Once | INTRAMUSCULAR | Status: AC
  Administered 2016-01-25: 5 mg via INTRAMUSCULAR
  Filled 2016-01-25: qty 2

## 2016-01-25 MED ORDER — PROCHLORPERAZINE MALEATE 10 MG PO TABS: 10.0000 mg | ORAL_TABLET | Freq: Four times a day (QID) | ORAL | Status: DC | PRN

## 2016-01-25 NOTE — Discharge Instructions (Signed)
1. You may take Compazine (#20) as needed for headache. 2. Return to the ER for worsening symptoms, persistent vomiting, lethargy or other concerns.  Tension Headache A tension headache is a feeling of pain, pressure, or aching that is often felt over the front and sides of the head. The pain can be dull, or it can feel tight (constricting). Tension headaches are not normally associated with nausea or vomiting, and they do not get worse with physical activity. Tension headaches can last from 30 minutes to several days. This is the most common type of headache. CAUSES The exact cause of this condition is not known. Tension headaches often begin after stress, anxiety, or depression. Other triggers may include:  Alcohol.  Too much caffeine, or caffeine withdrawal.  Respiratory infections, such as colds, flu, or sinus infections.  Dental problems or teeth clenching.  Fatigue.  Holding your head and neck in the same position for a long period of time, such as while using a computer.  Smoking. SYMPTOMS Symptoms of this condition include:  A feeling of pressure around the head.  Dull, aching head pain.  Pain felt over the front and sides of the head.  Tenderness in the muscles of the head, neck, and shoulders. DIAGNOSIS This condition may be diagnosed based on your symptoms and a physical exam. Tests may be done, such as a CT scan or an MRI of your head. These tests may be done if your symptoms are severe or unusual. TREATMENT This condition may be treated with lifestyle changes and medicines to help relieve symptoms. HOME CARE INSTRUCTIONS Managing Pain  Take over-the-counter and prescription medicines only as told by your health care provider.  Lie down in a dark, quiet room when you have a headache.  If directed, apply ice to the head and neck area:  Put ice in a plastic bag.  Place a towel between your skin and the bag.  Leave the ice on for 20 minutes, 2-3 times per  day.  Use a heating pad or a hot shower to apply heat to the head and neck area as told by your health care provider. Eating and Drinking  Eat meals on a regular schedule.  Limit alcohol use.  Decrease your caffeine intake, or stop using caffeine. General Instructions  Keep all follow-up visits as told by your health care provider. This is important.  Keep a headache journal to help find out what may trigger your headaches. For example, write down:  What you eat and drink.  How much sleep you get.  Any change to your diet or medicines.  Try massage or other relaxation techniques.  Limit stress.  Sit up straight, and avoid tensing your muscles.  Do not use tobacco products, including cigarettes, chewing tobacco, or e-cigarettes. If you need help quitting, ask your health care provider.  Exercise regularly as told by your health care provider.  Get 7-9 hours of sleep, or the amount recommended by your health care provider. SEEK MEDICAL CARE IF:  Your symptoms are not helped by medicine.  You have a headache that is different from what you normally experience.  You have nausea or you vomit.  You have a fever. SEEK IMMEDIATE MEDICAL CARE IF:  Your headache becomes severe.  You have repeated vomiting.  You have a stiff neck.  You have a loss of vision.  You have problems with speech.  You have pain in your eye or ear.  You have muscular weakness or loss of muscle  control.  You lose your balance or you have trouble walking.  You feel faint or you pass out.  You have confusion.   This information is not intended to replace advice given to you by your health care provider. Make sure you discuss any questions you have with your health care provider.   Document Released: 08/08/2005 Document Revised: 04/29/2015 Document Reviewed: 12/01/2014 [employer] Interactive Patient Education [employer]
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
## 2016-11-07 ENCOUNTER — Emergency Department
Admission: EM | Admit: 2016-11-07 | Discharge: 2016-11-07 | Disposition: A | Discharge status: Home / Self Care | Payer: Self-pay | Attending: Emergency Medicine | Admitting: Emergency Medicine

## 2016-11-07 ENCOUNTER — Emergency Department: Payer: Self-pay

## 2016-11-07 DIAGNOSIS — Y929 Unspecified place or not applicable: Secondary | ICD-10-CM | POA: Insufficient documentation

## 2016-11-07 DIAGNOSIS — R0789 Other chest pain: Secondary | ICD-10-CM

## 2016-11-07 DIAGNOSIS — Y999 Unspecified external cause status: Secondary | ICD-10-CM | POA: Insufficient documentation

## 2016-11-07 DIAGNOSIS — S20211A Contusion of right front wall of thorax, initial encounter: Secondary | ICD-10-CM | POA: Insufficient documentation

## 2016-11-07 DIAGNOSIS — F17210 Nicotine dependence, cigarettes, uncomplicated: Secondary | ICD-10-CM | POA: Insufficient documentation

## 2016-11-07 DIAGNOSIS — Y9389 Activity, other specified: Secondary | ICD-10-CM | POA: Insufficient documentation

## 2016-11-07 MED ORDER — KETOROLAC TROMETHAMINE 30 MG/ML IJ SOLN
30.0000 mg | Freq: Once | INTRAMUSCULAR | Status: AC
  Administered 2016-11-07: 30 mg via INTRAMUSCULAR
  Filled 2016-11-07: qty 1

## 2016-11-07 MED ORDER — MELOXICAM 7.5 MG PO TABS: 7.5000 mg | ORAL_TABLET | Freq: Every day | ORAL | 0 refills | Status: DC | PRN
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
## 2016-11-07 NOTE — Discharge Instructions (Signed)
1. You may take meloxicam as needed for discomfort. 2. Apply moist heat to affected area several times daily. 3. Return to the ER for worsening symptoms, persistent vomiting, difficulty breathing, fever or other concerns.

## 2016-11-07 NOTE — ED Notes (Signed)
Pt woken from a sound sleep to assess.

## 2016-11-07 NOTE — ED Notes (Signed)
Pt resting in lobby with eyes closed and unlabored respirations

## 2017-11-30 ENCOUNTER — Emergency Department
Admission: EM | Admit: 2017-11-30 | Discharge: 2017-11-30 | Disposition: A | Discharge status: Home / Self Care | Payer: Self-pay | Attending: Emergency Medicine | Admitting: Emergency Medicine

## 2017-11-30 ENCOUNTER — Encounter: Payer: Self-pay | Admitting: Emergency Medicine

## 2017-11-30 DIAGNOSIS — Y929 Unspecified place or not applicable: Secondary | ICD-10-CM | POA: Insufficient documentation

## 2017-11-30 DIAGNOSIS — Y9389 Activity, other specified: Secondary | ICD-10-CM | POA: Insufficient documentation

## 2017-11-30 DIAGNOSIS — F17210 Nicotine dependence, cigarettes, uncomplicated: Secondary | ICD-10-CM | POA: Insufficient documentation

## 2017-11-30 DIAGNOSIS — S01511A Laceration without foreign body of lip, initial encounter: Secondary | ICD-10-CM | POA: Insufficient documentation

## 2017-11-30 DIAGNOSIS — Y998 Other external cause status: Secondary | ICD-10-CM | POA: Insufficient documentation

## 2017-11-30 MED ORDER — CLINDAMYCIN HCL 300 MG PO CAPS: 300.0000 mg | ORAL_CAPSULE | Freq: Three times a day (TID) | ORAL | 0 refills | Status: DC

## 2017-11-30 MED ORDER — LIDOCAINE-EPINEPHRINE 2 %-1:100000 IJ SOLN
1.7000 mL | Freq: Once | INTRAMUSCULAR | Status: AC
  Administered 2017-11-30: 1.7 mL via INTRADERMAL
  Filled 2017-11-30: qty 1.7

## 2017-11-30 NOTE — Discharge Instructions (Signed)
Please take the antibiotic as prescribed. Have sutures removed in 5-7 days if they have not fallen out. Return to the ER for symptoms of concern.

## 2017-11-30 NOTE — ED Notes (Signed)
ED Provider at bedside. 
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
## 2017-12-07 ENCOUNTER — Other Ambulatory Visit: Payer: Self-pay

## 2017-12-07 ENCOUNTER — Emergency Department
Admission: EM | Admit: 2017-12-07 | Discharge: 2017-12-07 | Disposition: A | Discharge status: Home / Self Care | Payer: Self-pay | Attending: Emergency Medicine | Admitting: Emergency Medicine

## 2017-12-07 ENCOUNTER — Encounter: Payer: Self-pay | Admitting: Emergency Medicine

## 2017-12-07 DIAGNOSIS — Y658 Other specified misadventures during surgical and medical care: Secondary | ICD-10-CM | POA: Insufficient documentation

## 2017-12-07 DIAGNOSIS — T8130XA Disruption of wound, unspecified, initial encounter: Secondary | ICD-10-CM | POA: Insufficient documentation

## 2017-12-07 DIAGNOSIS — F17210 Nicotine dependence, cigarettes, uncomplicated: Secondary | ICD-10-CM | POA: Insufficient documentation

## 2017-12-07 MED ORDER — NEOMYCIN-POLYMYXIN-PRAMOXINE 1 % EX CREA: TOPICAL_CREAM | Freq: Two times a day (BID) | CUTANEOUS | 0 refills | Status: DC

## 2017-12-07 NOTE — ED Provider Notes (Signed)
[facility] Emergency Department Provider Note   ____________________________________________   First MD Initiated Contact with Patient 12/07/17 1216     (approximate)  I have reviewed the triage vital signs and the nursing notes.   HISTORY  Chief Complaint wound check    HPI Eric Mcfarland is a 37 y.o. male requested to have his lip resutured.  Patient was seen here 7 days ago for lip laceration.  Patient has orbital sutures placed inside his lip.  Patient said his sutures dissolved or came loose and his wound is reopened.  Patient rates his pain as a 6/10.  Patient described the pain is "achy".  No palates measured for complaint.  Patient still has 3 days of antibiotics left from previous prescription for this injury.   Past Medical History:  Diagnosis Date  . GERD (gastroesophageal reflux disease)   . Reflux     There are no active problems to display for this patient.   History reviewed. No pertinent surgical history.  Prior to Admission medications   Medication Sig Start Date End Date Taking? Authorizing Provider  clindamycin (CLEOCIN) 300 MG capsule Take 1 capsule (300 mg total) by mouth 3 (three) times daily. 11/30/17   Triplett, Rulon Eisenmengerari B, FNP  meloxicam (MOBIC) 7.5 MG tablet Take 1 tablet (7.5 mg total) by mouth daily as needed for pain. 11/07/16   Irean HongSung, Jade J, MD  neomycin-polymyxin-pramoxine (NEOSPORIN PLUS) 1 % cream Apply topically 2 (two) times daily. 12/07/17   Joni ReiningSmith, Hedwig Mcfall K, PA-C  prochlorperazine (COMPAZINE) 10 MG tablet Take 1 tablet (10 mg total) by mouth every 6 (six) hours as needed for nausea. 01/25/16   Irean HongSung, Jade J, MD    Allergies Penicillins  History reviewed. No pertinent family history.  Social History Social History   Tobacco Use  . Smoking status: Current Every Day Smoker    Packs/day: 1.00    Types: Cigarettes  . Smokeless tobacco: Never Used  Substance Use Topics  . Alcohol use: Yes  . Drug use: Yes   Types: Marijuana    Comment: last smoked 4-5 days ago    Review of Systems Constitutional: No fever/chills Eyes: No visual changes. ENT: No sore throat. Cardiovascular: Denies chest pain. Respiratory: Denies shortness of breath. Gastrointestinal: No abdominal pain.  No nausea, no vomiting.  No diarrhea.  No constipation. Genitourinary: Negative for dysuria. Musculoskeletal: Negative for back pain. Skin: Right lateral lip laceration Neurological: Negative for headaches, focal weakness or numbness.   ____________________________________________   PHYSICAL EXAM:  VITAL SIGNS: ED Triage Vitals [12/07/17 1206]  Enc Vitals Group     BP 125/62     Pulse Rate 100     Resp 18     Temp 98.7 F (37.1 C)     Temp Source Oral     SpO2 100 %     Weight 150 lb (68 kg)     Height 5' 5.5" (1.664 m)     Head Circumference      Peak Flow      Pain Score      Pain Loc      Pain Edu?      Excl. in GC?    Constitutional: Alert and oriented. Well appearing and in no acute distress. Cardiovascular: Normal rate, regular rhythm. Grossly normal heart sounds.  Good peripheral circulation. Respiratory: Normal respiratory effort.  No retractions. Lungs CTAB. Musculoskeletal: No lower extremity tenderness nor edema.  No joint effusions. Neurologic:  Normal speech and language.  No gross focal neurologic deficits are appreciated. No gait instability. Skin: Laceration to the right lateral inferior lip.  Wound is healing by granulation. Psychiatric: Mood and affect are normal. Speech and behavior are normal.  ____________________________________________   LABS (all labs ordered are listed, but only abnormal results are displayed)  Labs Reviewed - No data to display ____________________________________________  EKG   ____________________________________________  RADIOLOGY  ED MD interpretation:    Official radiology report(s): No results  found.  ____________________________________________   PROCEDURES  Procedure(s) performed: None  Procedures  Critical Care performed: No  ____________________________________________   INITIAL IMPRESSION / ASSESSMENT AND PLAN / ED COURSE  As part of my medical decision making, I reviewed the following data within the electronic MEDICAL RECORD NUMBER    Status post lip laceration repair now presents with dehiscence.  Discussed with patient rationale for not re-suturing wound at this time.  Patient given discharge care instructions.  Patient advised continue previous medication.  Patient advised to apply Neosporin as directed to area until healing is complete.  Return back if condition worsens.      ____________________________________________   FINAL CLINICAL IMPRESSION(S) / ED DIAGNOSES  Final diagnoses:  Wound dehiscence     ED Discharge Orders        Ordered    neomycin-polymyxin-pramoxine (NEOSPORIN PLUS) 1 % cream  2 times daily     12/07/17 1224       Note:  This document was prepared using Dragon voice recognition software and may include unintentional dictation errors.    Joni Reining, PA-C 12/07/17 1236    Darci Current, MD 12/07/17 [phone]

## 2017-12-07 NOTE — ED Notes (Signed)
Pt states his stitches came loose and needs them re stitched

## 2017-12-07 NOTE — Discharge Instructions (Signed)
Follow discharge care instructions.  Continue previous medication.  Start Neosporin and apply as directed..Marland Kitchen

## 2017-12-07 NOTE — ED Triage Notes (Signed)
Pt here to have wound rechecked

## 2018-01-06 ENCOUNTER — Emergency Department
Admission: EM | Admit: 2018-01-06 | Discharge: 2018-01-06 | Disposition: A | Discharge status: Home / Self Care | Payer: Self-pay | Attending: Emergency Medicine | Admitting: Emergency Medicine

## 2018-01-06 ENCOUNTER — Emergency Department: Payer: Self-pay

## 2018-01-06 DIAGNOSIS — Y939 Activity, unspecified: Secondary | ICD-10-CM | POA: Insufficient documentation

## 2018-01-06 DIAGNOSIS — F17210 Nicotine dependence, cigarettes, uncomplicated: Secondary | ICD-10-CM | POA: Insufficient documentation

## 2018-01-06 DIAGNOSIS — X58XXXA Exposure to other specified factors, initial encounter: Secondary | ICD-10-CM | POA: Insufficient documentation

## 2018-01-06 DIAGNOSIS — Y929 Unspecified place or not applicable: Secondary | ICD-10-CM | POA: Insufficient documentation

## 2018-01-06 DIAGNOSIS — Y999 Unspecified external cause status: Secondary | ICD-10-CM | POA: Insufficient documentation

## 2018-01-06 DIAGNOSIS — Z79899 Other long term (current) drug therapy: Secondary | ICD-10-CM | POA: Insufficient documentation

## 2018-01-06 DIAGNOSIS — S61411A Laceration without foreign body of right hand, initial encounter: Secondary | ICD-10-CM | POA: Insufficient documentation

## 2018-01-06 MED ORDER — SULFAMETHOXAZOLE-TRIMETHOPRIM 800-160 MG PO TABS: 1.0000 | ORAL_TABLET | Freq: Two times a day (BID) | ORAL | 0 refills | Status: DC

## 2018-01-06 MED ORDER — LIDOCAINE HCL (PF) 1 % IJ SOLN
5.0000 mL | Freq: Once | INTRAMUSCULAR | Status: DC
  Filled 2018-01-06: qty 5
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
## 2018-01-06 NOTE — ED Notes (Signed)
Pt had visit from mom, sister (phone numbers left for him to get ride home). One brother and now another brother visiting. They know pt will be here for awhile and we will call when he awakes.

## 2018-01-06 NOTE — ED Notes (Signed)
Spoke with mother linda who will come and give pt ride home. Mom linda's number [phone]. Sister ebony phone number [phone]
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
## 2018-01-06 NOTE — Discharge Instructions (Signed)
Please seek medical attention for any high fevers, chest pain, shortness of breath, change in behavior, persistent vomiting, bloody stool or any other new or concerning symptoms.  

## 2018-03-21 ENCOUNTER — Emergency Department
Admission: EM | Admit: 2018-03-21 | Discharge: 2018-03-21 | Disposition: A | Discharge status: Home / Self Care | Payer: Self-pay | Attending: Student in an Organized Health Care Education/Training Program | Admitting: Student in an Organized Health Care Education/Training Program

## 2018-03-21 ENCOUNTER — Other Ambulatory Visit: Payer: Self-pay

## 2018-03-21 DIAGNOSIS — Y9389 Activity, other specified: Secondary | ICD-10-CM | POA: Insufficient documentation

## 2018-03-21 DIAGNOSIS — Y9289 Other specified places as the place of occurrence of the external cause: Secondary | ICD-10-CM | POA: Insufficient documentation

## 2018-03-21 DIAGNOSIS — M2550 Pain in unspecified joint: Secondary | ICD-10-CM | POA: Insufficient documentation

## 2018-03-21 DIAGNOSIS — R509 Fever, unspecified: Secondary | ICD-10-CM | POA: Insufficient documentation

## 2018-03-21 DIAGNOSIS — Y999 Unspecified external cause status: Secondary | ICD-10-CM | POA: Insufficient documentation

## 2018-03-21 DIAGNOSIS — S30863A Insect bite (nonvenomous) of scrotum and testes, initial encounter: Secondary | ICD-10-CM | POA: Insufficient documentation

## 2018-03-21 DIAGNOSIS — R197 Diarrhea, unspecified: Secondary | ICD-10-CM | POA: Insufficient documentation

## 2018-03-21 DIAGNOSIS — W57XXXA Bitten or stung by nonvenomous insect and other nonvenomous arthropods, initial encounter: Secondary | ICD-10-CM | POA: Insufficient documentation

## 2018-03-21 DIAGNOSIS — F17210 Nicotine dependence, cigarettes, uncomplicated: Secondary | ICD-10-CM | POA: Insufficient documentation

## 2018-03-21 MED ORDER — DOXYCYCLINE HYCLATE 100 MG PO CAPS: 100.0000 mg | ORAL_CAPSULE | Freq: Two times a day (BID) | ORAL | 0 refills | Status: DC

## 2018-03-21 NOTE — ED Notes (Signed)
Patient states "I had a tick bite me on my private area but its no longer there" Denies swelling. Reports he had fever last night. A&O x4 ambulatory with noproblems

## 2018-03-21 NOTE — ED Provider Notes (Signed)
[facility] Emergency Department Provider Note  ____________________________________________   First MD Initiated Contact with Patient 03/21/18 1349     (approximate)  I have reviewed the triage vital signs and the nursing notes.   HISTORY  Chief Complaint No chief complaint on file.    HPI Eric Mcfarland is a 37 y.o. male presents emergency department complaining of a tick bite to his private areas.  He states he seen a week ago and yesterday noticed a tick on his scrotum.  He states he removed the tick but has had fever and chills which started last night.  He was unsure if it was from drinking too much or if this is from the tick.  States all of his joints hurt.  He denies any cough or congestion, sore throat, nausea or vomiting.  He states he had one episode of diarrhea this morning.    Past Medical History:  Diagnosis Date  . GERD (gastroesophageal reflux disease)   . Reflux     There are no active problems to display for this patient.   History reviewed. No pertinent surgical history.  Prior to Admission medications   Medication Sig Start Date End Date Taking? Authorizing Provider  doxycycline (VIBRAMYCIN) 100 MG capsule Take 1 capsule (100 mg total) by mouth 2 (two) times daily. 03/21/18   Faythe Ghee, PA-C    Allergies Penicillins  History reviewed. No pertinent family history.  Social History Social History   Tobacco Use  . Smoking status: Current Every Day Smoker    Packs/day: 1.00    Types: Cigarettes  . Smokeless tobacco: Never Used  Substance Use Topics  . Alcohol use: Yes  . Drug use: Yes    Types: Marijuana    Comment: last smoked 4-5 days ago    Review of Systems  Constitutional: Positive fever/chills Eyes: No visual changes. ENT: No sore throat. Respiratory: Denies cough Genitourinary: Negative for dysuria. Musculoskeletal: Negative for back pain. Skin: Negative for rash.  Positive for tick  bite    ____________________________________________   PHYSICAL EXAM:  VITAL SIGNS: ED Triage Vitals  Enc Vitals Group     BP 03/21/18 1237 138/73     Pulse Rate 03/21/18 1237 72     Resp 03/21/18 1237 16     Temp 03/21/18 1237 98.2 F (36.8 C)     Temp Source 03/21/18 1237 Oral     SpO2 03/21/18 1237 100 %     Weight 03/21/18 1239 152 lb (68.9 kg)     Height 03/21/18 1239 5\' 5"  (1.651 m)     Head Circumference --      Peak Flow --      Pain Score 03/21/18 1238 5     Pain Loc --      Pain Edu? --      Excl. in GC? --     Constitutional: Alert and oriented. Well appearing and in no acute distress. Eyes: Conjunctivae are normal.  Head: Atraumatic. Nose: No congestion/rhinnorhea. Mouth/Throat: Mucous membranes are moist.   Neck:  supple no lymphadenopathy noted Cardiovascular: Normal rate, regular rhythm. Heart sounds are normal Respiratory: Normal respiratory effort.  No retractions, lungs c t a  Abd: soft nontender bs normal all 4 quad GU: deferred Musculoskeletal: FROM all extremities, warm and well perfused Neurologic:  Normal speech and language.  Skin:  Skin is warm, dry and intact. No rash noted. Psychiatric: Mood and affect are normal. Speech and behavior are normal.  ____________________________________________  LABS (all labs ordered are listed, but only abnormal results are displayed)  Labs Reviewed - No data to display ____________________________________________   ____________________________________________  RADIOLOGY    ____________________________________________   PROCEDURES  Procedure(s) performed: No  Procedures    ____________________________________________   INITIAL IMPRESSION / ASSESSMENT AND PLAN / ED COURSE  Pertinent labs & imaging results that were available during my care of the patient were reviewed by me and considered in my medical decision making (see chart for details).   Patient is 37 year old male presents  emergency department complaining of a tick bite to the scrotum.  He states he went fishing a week ago and that was only time he was outside.  Then yesterday he noticed a tick on his scrotum.  He removed the tick but started with fever last night and this morning.  Is also having joint pain.  Physical exam he appears well.  He is afebrile at this time.  No rashes noted.  Remainder the exam is unremarkable  Discussed findings with patient.  Explained to him since he had a tick bite and has developed fever he should take doxycycline.  Prescription for doxycycline was provided.  He is to follow-up with his regular doctor or the acute care if not better in 5 to 7 days.  Return if worsening.  He states he understands will comply.  Was discharged in stable condition     As part of my medical decision making, I reviewed the following data within the electronic MEDICAL RECORD NUMBER Nursing notes reviewed and incorporated, Old chart reviewed, Notes from prior ED visits and [place] Controlled Substance Database  ____________________________________________   FINAL CLINICAL IMPRESSION(S) / ED DIAGNOSES  Final diagnoses:  Tick bite, initial encounter      NEW MEDICATIONS STARTED DURING THIS VISIT:  Discharge Medication List as of 03/21/2018  2:07 PM    START taking these medications   Details  doxycycline (VIBRAMYCIN) 100 MG capsule Take 1 capsule (100 mg total) by mouth 2 (two) times daily., Starting Wed 03/21/2018, Print         Note:  This document was prepared using Dragon voice recognition software and may include unintentional dictation errors.    Faythe GheeFisher, Susan W, PA-C 03/21/18 1729    Willy Eddyobinson, Patrick, MD 03/23/18 [id number]

## 2018-03-21 NOTE — ED Triage Notes (Signed)
Pt states he believes a tick bit him on Thursday. Subjective fever over night. Alert, oriented, ambulatory. No distress noted. States tick bite on "private area." states tick has been removed.

## 2018-03-21 NOTE — Discharge Instructions (Addendum)
Follow-up with your regular doctor if not better in 3 to 5 days.  Return emergency department if worsening.  Take medication as prescribed.  [facility] has the use prescription for $18 and hears [facility] has this for $12

## 2020-02-09 ENCOUNTER — Emergency Department
Admission: EM | Admit: 2020-02-09 | Discharge: 2020-02-09 | Disposition: A | Discharge status: Home / Self Care | Payer: Self-pay | Attending: Emergency Medicine | Admitting: Emergency Medicine

## 2020-02-09 ENCOUNTER — Other Ambulatory Visit: Payer: Self-pay

## 2020-02-09 ENCOUNTER — Emergency Department: Payer: Self-pay

## 2020-02-09 DIAGNOSIS — Y999 Unspecified external cause status: Secondary | ICD-10-CM | POA: Insufficient documentation

## 2020-02-09 DIAGNOSIS — Y939 Activity, unspecified: Secondary | ICD-10-CM | POA: Insufficient documentation

## 2020-02-09 DIAGNOSIS — Y929 Unspecified place or not applicable: Secondary | ICD-10-CM | POA: Insufficient documentation

## 2020-02-09 DIAGNOSIS — X58XXXA Exposure to other specified factors, initial encounter: Secondary | ICD-10-CM | POA: Insufficient documentation

## 2020-02-09 DIAGNOSIS — F17210 Nicotine dependence, cigarettes, uncomplicated: Secondary | ICD-10-CM | POA: Insufficient documentation

## 2020-02-09 DIAGNOSIS — S39012A Strain of muscle, fascia and tendon of lower back, initial encounter: Secondary | ICD-10-CM | POA: Insufficient documentation

## 2020-02-09 MED ORDER — BACLOFEN 10 MG PO TABS: 10.0000 mg | ORAL_TABLET | Freq: Three times a day (TID) | ORAL | 0 refills | Status: DC

## 2020-02-09 MED ORDER — MELOXICAM 15 MG PO TABS: 15.0000 mg | ORAL_TABLET | Freq: Every day | ORAL | 2 refills | Status: DC

## 2020-02-09 NOTE — Discharge Instructions (Signed)
Follow-up with your regular doctor if not improving in 2 to 3 days.  Return emergency department worsening.  Use medications as prescribed.  Apply ice to your lower back.

## 2020-02-09 NOTE — ED Provider Notes (Signed)
[facility] Emergency Department Provider Note  ____________________________________________   First MD Initiated Contact with Patient 02/09/20 1210     (approximate)  I have reviewed the triage vital signs and the nursing notes.   HISTORY  Chief Complaint Back Pain    HPI Eric Mcfarland is a 39 y.o. male presents emergency department complaining of low back pain for a week, pain radiates to the thigh, some weakness and not feeling well.  No burning with urination.  States the pain is worse in the mornings and he does not notice this as much during the day.  Patient is a smoker.   No other significant medical history.  Pain is rated as 5/10, denies loss of bowel control or bladder control, no known injury, never had the symptoms before   Past Medical History:  Diagnosis Date  . GERD (gastroesophageal reflux disease)   . Reflux     There are no problems to display for this patient.   History reviewed. No pertinent surgical history.  Prior to Admission medications   Medication Sig Start Date End Date Taking? Authorizing Provider  baclofen (LIORESAL) 10 MG tablet Take 1 tablet (10 mg total) by mouth 3 (three) times daily. 02/09/20 02/08/21  Josilynn Losh, Linden Dolin, PA-C  meloxicam (MOBIC) 15 MG tablet Take 1 tablet (15 mg total) by mouth daily. 02/09/20 02/08/21  Versie Starks, PA-C    Allergies Penicillins  History reviewed. No pertinent family history.  Social History Social History   Tobacco Use  . Smoking status: Current Every Day Smoker    Packs/day: 1.00    Types: Cigarettes  . Smokeless tobacco: Never Used  Substance Use Topics  . Alcohol use: Yes  . Drug use: Yes    Types: Marijuana    Comment: last smoked 4-5 days ago    Review of Systems  Constitutional: No fever/chills Eyes: No visual changes. ENT: No sore throat. Respiratory: Denies cough Cardiovascular: Denies chest pain Gastrointestinal: Denies abdominal  pain Genitourinary: Negative for dysuria. Musculoskeletal: Positive for back pain. Skin: Negative for rash. Psychiatric: no mood changes,     ____________________________________________   PHYSICAL EXAM:  VITAL SIGNS: ED Triage Vitals [02/09/20 1140]  Enc Vitals Group     BP 118/73     Pulse Rate 72     Resp 16     Temp 98.2 F (36.8 C)     Temp Source Oral     SpO2 100 %     Weight 155 lb (70.3 kg)     Height 5\' 5"  (1.651 m)     Head Circumference      Peak Flow      Pain Score 5     Pain Loc      Pain Edu?      Excl. in [place]?     Constitutional: Alert and oriented. Well appearing and in no acute distress. Eyes: Conjunctivae are normal.  Head: Atraumatic. Nose: No congestion/rhinnorhea. Mouth/Throat: Mucous membranes are moist.   Neck:  supple no lymphadenopathy noted Cardiovascular: Normal rate, regular rhythm. Heart sounds are normal Respiratory: Normal respiratory effort.  No retractions, lungs c t a  Abd: soft nontender bs normal all 4 quad, right flank is tender to palpation GU: deferred Musculoskeletal: FROM all extremities, warm and well perfused, spine is not tender, right flank is tender, muscle spasms, patient has reproduced pain with forward flexion Neurologic:  Normal speech and language.  Skin:  Skin is warm, dry and intact. No  rash noted. Psychiatric: Mood and affect are normal. Speech and behavior are normal.  ____________________________________________   LABS (all labs ordered are listed, but only abnormal results are displayed)  Labs Reviewed - No data to display ____________________________________________   ____________________________________________  RADIOLOGY  Lumbar spine is normal  ____________________________________________   PROCEDURES  Procedure(s) performed: No  Procedures    ____________________________________________   INITIAL IMPRESSION / ASSESSMENT AND PLAN / ED COURSE  Pertinent labs & imaging results  that were available during my care of the patient were reviewed by me and considered in my medical decision making (see chart for details).   Patient is 39 year old male presents emergency department with concerns of low back pain with radiation to the right leg.  See HPI.  Physical exam is consistent with lumbar strain but also have concerns for UTI due to flank sided pain.  DDx: Lumbar strain, bulging disc, kidney stone, UTI  UA and x-ray of the lumbar spine  Patient is refusing urinalysis.  X-ray lumbar spine does not show any abnormality.  I did discuss findings with the patient.  He was given a prescription for meloxicam and a muscle relaxer.  He is to follow-up with his regular doctor if not improved in 3 to 4 days.  Patient needs a work note to return tomorrow as he has been out for several days.  He was discharged in stable condition.    Eric Mcfarland was evaluated in Emergency Department on 02/09/2020 for the symptoms described in the history of present illness. He was evaluated in the context of the global COVID-19 pandemic, which necessitated consideration that the patient might be at risk for infection with the SARS-CoV-2 virus that causes COVID-19. Institutional protocols and algorithms that pertain to the evaluation of patients at risk for COVID-19 are in a state of rapid change based on information released by regulatory bodies including the CDC and federal and state organizations. These policies and algorithms were followed during the patient's care in the ED.   As part of my medical decision making, I reviewed the following data within the electronic MEDICAL RECORD NUMBER Nursing notes reviewed and incorporated, Old chart reviewed, Radiograph reviewed , Notes from prior ED visits and [place] Controlled Substance Database  ____________________________________________   FINAL CLINICAL IMPRESSION(S) / ED DIAGNOSES  Final diagnoses:  Acute myofascial strain of lumbar region, initial  encounter      NEW MEDICATIONS STARTED DURING THIS VISIT:  Discharge Medication List as of 02/09/2020  1:32 PM    START taking these medications   Details  baclofen (LIORESAL) 10 MG tablet Take 1 tablet (10 mg total) by mouth 3 (three) times daily., Starting Sun 02/09/2020, Until Mon 02/08/2021, Normal    meloxicam (MOBIC) 15 MG tablet Take 1 tablet (15 mg total) by mouth daily., Starting Sun 02/09/2020, Until Mon 02/08/2021, Normal         Note:  This document was prepared using Dragon voice recognition software and may include unintentional dictation errors.    Faythe Ghee, PA-C 02/09/20 1640    Chesley Noon, MD 02/14/20 2034

## 2020-02-09 NOTE — ED Triage Notes (Signed)
Pt arrives to ER c/o low back pain x 1 week. Denies injury. Denies dysuria.

## 2020-09-27 ENCOUNTER — Emergency Department: Payer: Self-pay

## 2020-09-27 ENCOUNTER — Other Ambulatory Visit: Payer: Self-pay

## 2020-09-27 ENCOUNTER — Encounter: Payer: Self-pay | Admitting: Emergency Medicine

## 2020-09-27 ENCOUNTER — Emergency Department
Admission: EM | Admit: 2020-09-27 | Discharge: 2020-09-27 | Disposition: A | Discharge status: Home / Self Care | Payer: Self-pay | Attending: Emergency Medicine | Admitting: Emergency Medicine

## 2020-09-27 DIAGNOSIS — F17210 Nicotine dependence, cigarettes, uncomplicated: Secondary | ICD-10-CM | POA: Insufficient documentation

## 2020-09-27 DIAGNOSIS — K254 Chronic or unspecified gastric ulcer with hemorrhage: Secondary | ICD-10-CM | POA: Insufficient documentation

## 2020-09-27 DIAGNOSIS — K219 Gastro-esophageal reflux disease without esophagitis: Secondary | ICD-10-CM | POA: Insufficient documentation

## 2020-09-27 DIAGNOSIS — K920 Hematemesis: Secondary | ICD-10-CM | POA: Insufficient documentation

## 2020-09-27 LAB — COMPREHENSIVE METABOLIC PANEL
ALT: 18 U/L (ref 0–44)
AST: 36 U/L (ref 15–41)
Albumin: 4.6 g/dL (ref 3.5–5.0)
Alkaline Phosphatase: 59 U/L (ref 38–126)
Anion gap: 17 — ABNORMAL HIGH (ref 5–15)
BUN: 14 mg/dL (ref 6–20)
CO2: 21 mmol/L — ABNORMAL LOW (ref 22–32)
Calcium: 9.8 mg/dL (ref 8.9–10.3)
Chloride: 100 mmol/L (ref 98–111)
Creatinine, Ser: 1.07 mg/dL (ref 0.61–1.24)
GFR, Estimated: >60 mL/min (ref >60)
Glucose, Bld: 157 mg/dL — ABNORMAL HIGH (ref 70–99)
Potassium: 4.8 mmol/L (ref 3.5–5.1)
Sodium: 138 mmol/L (ref 135–145)
Total Bilirubin: 1 mg/dL (ref 0.3–1.2)
Total Protein: 8.4 g/dL — ABNORMAL HIGH (ref 6.5–8.1)

## 2020-09-27 LAB — LIPASE, BLOOD: Lipase: 26 U/L (ref 11–51)

## 2020-09-27 LAB — CBC
HCT: 43.7 % (ref 39.0–52.0)
Hemoglobin: 14.8 g/dL (ref 13.0–17.0)
MCH: 27.8 pg (ref 26.0–34.0)
MCHC: 33.9 g/dL (ref 30.0–36.0)
MCV: 82.1 fL (ref 80.0–100.0)
Platelets: 177 10*3/uL (ref 150–400)
RBC: 5.32 MIL/uL (ref 4.22–5.81)
RDW: 13.8 % (ref 11.5–15.5)
WBC: 8.5 10*3/uL (ref 4.0–10.5)
nRBC: 0 % (ref 0.0–0.2)

## 2020-09-27 MED ORDER — OMEPRAZOLE 10 MG PO CPDR: 10.0000 mg | DELAYED_RELEASE_CAPSULE | Freq: Every day | ORAL | 11 refills | Status: DC

## 2020-09-27 MED ORDER — ONDANSETRON HCL 4 MG/2ML IJ SOLN
4.0000 mg | Freq: Once | INTRAMUSCULAR | Status: AC
  Administered 2020-09-27: 4 mg via INTRAVENOUS
  Filled 2020-09-27: qty 2

## 2020-09-27 MED ORDER — PROMETHAZINE HCL 25 MG PO TABS: 25.0000 mg | ORAL_TABLET | Freq: Four times a day (QID) | ORAL | 0 refills | Status: DC | PRN

## 2020-09-27 MED ORDER — IOHEXOL 300 MG/ML  SOLN
100.0000 mL | Freq: Once | INTRAMUSCULAR | Status: AC | PRN
  Administered 2020-09-27: 100 mL via INTRAVENOUS
  Filled 2020-09-27: qty 100

## 2020-09-27 MED ORDER — SODIUM CHLORIDE 0.9 % IV BOLUS
1000.0000 mL | Freq: Once | INTRAVENOUS | Status: AC
  Administered 2020-09-27: 1000 mL via INTRAVENOUS

## 2020-09-27 MED ORDER — ALUM & MAG HYDROXIDE-SIMETH 200-200-20 MG/5ML PO SUSP
30.0000 mL | Freq: Once | ORAL | Status: AC
  Administered 2020-09-27: 30 mL via ORAL
  Filled 2020-09-27: qty 30

## 2020-09-27 MED ORDER — LIDOCAINE VISCOUS HCL 2 % MT SOLN
15.0000 mL | Freq: Once | OROMUCOSAL | Status: AC
  Administered 2020-09-27: 15 mL via ORAL
  Filled 2020-09-27: qty 15

## 2020-09-27 MED ORDER — GI COCKTAIL ~~LOC~~: 30.0000 mL | Freq: Two times a day (BID) | ORAL | 0 refills | Status: DC | PRN

## 2020-09-27 NOTE — ED Triage Notes (Signed)
Pt to ED via [facility] with c/o emesis that started last night. Per EMS pt with hx of gastric ulcers, ETOH last night, and ate breakfast this morning and began vomiting. Per EMS pt did report some blood tinge to his vomit at this time.    141/80 92HR 100% RA

## 2020-09-27 NOTE — ED Provider Notes (Signed)
[facility] Emergency Department Provider Note  ____________________________________________  Time seen: Approximately 12:27 PM  I have reviewed the triage vital signs and the nursing notes.   HISTORY  Chief Complaint Abdominal Pain    HPI TYMEL CONELY is a 40 y.o. male who presents the emergency department complaining of sharp epigastric abdominal pain.  Patient has a history of acid reflux as well as gastric ulcers.   Patient states that over the last 3 days he has had increasing epigastric pain, nausea vomiting and some hematemesis.  Patient states that this is similar to his previous episode with gastric ulcers.  Patient states that he had been on medication for a long time for his ulcers, stopped having problems and has not been on medicine in the recent past.  Patient denies any fevers or chills, nasal congestion, sore throat, cough.  Pain is located in the epigastric region with no other abdominal pain.  No diarrhea or constipation.  Patient has not tried any medications for his complaint prior to arrival.        Past Medical History:  Diagnosis Date  . GERD (gastroesophageal reflux disease)   . Reflux     There are no problems to display for this patient.   History reviewed. No pertinent surgical history.  Prior to Admission medications   Medication Sig Start Date End Date Taking? Authorizing Provider  Alum & Mag Hydroxide-Simeth (GI COCKTAIL) SUSP suspension Take 30 mLs by mouth 2 (two) times daily as needed (epigastric pain). Shake well. 09/27/20  Yes Bryana Froemming, Delorise Royals, PA-C  omeprazole (PRILOSEC) 10 MG capsule Take 1 capsule (10 mg total) by mouth daily. 09/27/20  Yes Shauntelle Jamerson, Delorise Royals, PA-C  promethazine (PHENERGAN) 25 MG tablet Take 1 tablet (25 mg total) by mouth every 6 (six) hours as needed for nausea or vomiting. 09/27/20  Yes Dickie Labarre, Delorise Royals, PA-C  baclofen (LIORESAL) 10 MG tablet Take 1 tablet (10 mg total) by mouth 3  (three) times daily. 02/09/20 02/08/21  Fisher, Roselyn Bering, PA-C  meloxicam (MOBIC) 15 MG tablet Take 1 tablet (15 mg total) by mouth daily. 02/09/20 02/08/21  Faythe Ghee, PA-C    Allergies Penicillins  History reviewed. No pertinent family history.  Social History Social History   Tobacco Use  . Smoking status: Current Every Day Smoker    Packs/day: 1.00    Types: Cigarettes  . Smokeless tobacco: Never Used  Substance Use Topics  . Alcohol use: Yes  . Drug use: Yes    Types: Marijuana    Comment: last smoked 4-5 days ago     Review of Systems  Constitutional: No fever/chills Eyes: No visual changes. No discharge ENT: No upper respiratory complaints. Cardiovascular: no chest pain. Respiratory: no cough. No SOB. Gastrointestinal: Epigastric abdominal pain.  Positive for nausea, vomiting, some hematemesis.  No diarrhea.  No constipation. Genitourinary: Negative for dysuria. No hematuria Musculoskeletal: Negative for musculoskeletal pain. Skin: Negative for rash, abrasions, lacerations, ecchymosis. Neurological: Negative for headaches, focal weakness or numbness.  10 System ROS otherwise negative.  ____________________________________________   PHYSICAL EXAM:  VITAL SIGNS: ED Triage Vitals  Enc Vitals Group     BP 09/27/20 1015 131/73     Pulse Rate 09/27/20 1015 75     Resp 09/27/20 1015 18     Temp 09/27/20 1015 98.2 F (36.8 C)     Temp Source 09/27/20 1015 Oral     SpO2 09/27/20 1015 100 %     Weight 09/27/20  1011 140 lb (63.5 kg)     Height 09/27/20 1011 5\' 6"  (1.676 m)     Head Circumference --      Peak Flow --      Pain Score 09/27/20 1011 10     Pain Loc --      Pain Edu? --      Excl. in [place]? --      Constitutional: Alert and oriented. Well appearing and in no acute distress. Eyes: Conjunctivae are normal. PERRL. EOMI. Head: Atraumatic. ENT:      Ears:       Nose: No congestion/rhinnorhea.      Mouth/Throat: Mucous membranes are moist.   Neck: No stridor.    Cardiovascular: Normal rate, regular rhythm. Normal S1 and S2.  Good peripheral circulation. Respiratory: Normal respiratory effort without tachypnea or retractions. Lungs CTAB. Good air entry to the bases with no decreased or absent breath sounds. Gastrointestinal: No visible external abdominal findings.  Bowel sounds 4 quadrants.  Soft to palpation all quadrants.  Patient is tender to palpation in the epigastric region.  No other tenderness to palpation.. No guarding or rigidity. No palpable masses. No distention. No CVA tenderness. Musculoskeletal: Full range of motion to all extremities. No gross deformities appreciated. Neurologic:  Normal speech and language. No gross focal neurologic deficits are appreciated.  Skin:  Skin is warm, dry and intact. No rash noted. Psychiatric: Mood and affect are normal. Speech and behavior are normal. Patient exhibits appropriate insight and judgement.   ____________________________________________   LABS (all labs ordered are listed, but only abnormal results are displayed)  Labs Reviewed  COMPREHENSIVE METABOLIC PANEL - Abnormal; Notable for the following components:      Result Value   CO2 21 (*)    Glucose, Bld 157 (*)    Total Protein 8.4 (*)    Anion gap 17 (*)    All other components within normal limits  LIPASE, BLOOD  CBC  URINALYSIS, COMPLETE (UACMP) WITH MICROSCOPIC   ____________________________________________  EKG   ____________________________________________  RADIOLOGY I personally viewed and evaluated these images as part of my medical decision making, as well as reviewing the written report by the radiologist.  ED Provider Interpretation: Mild enteritis, otherwise no acute findings on CT  CT ABDOMEN PELVIS W CONTRAST  Result Date: 09/27/2020 CLINICAL DATA:  Vomiting.  History of gastric ulcers. EXAM: CT ABDOMEN AND PELVIS WITH CONTRAST TECHNIQUE: Multidetector CT imaging of the abdomen and pelvis  was performed using the standard protocol following bolus administration of intravenous contrast. CONTRAST:  11/25/2020 OMNIPAQUE IOHEXOL 300 MG/ML  SOLN COMPARISON:  04/16/2013; 08/23/2012 FINDINGS: Lower chest: Limited visualization of the lower thorax is negative for focal airspace opacity or pleural effusion. Normal heart size.  No pericardial effusion. Hepatobiliary: Scattered subcentimeter hypoattenuating hepatic lesions (axial images 11, 14 and 15, series 2) are too small to adequately characterize though favored to represent hepatic cysts with subcentimeter lesion within the left lobe of the liver appearing similar to the 2014 examination. There is a minimal amount of focal fatty infiltration adjacent to the fissure for the ligamentum teres. Normal appearance of the gallbladder given underdistention. No radiopaque gallstones. No intra or extrahepatic biliary duct dilatation. No ascites. Pancreas: Normal appearance of the pancreas. No definitive peripancreatic stranding. Spleen: Normal appearance of the spleen. Adrenals/Urinary Tract: There is symmetric enhancement of the bilateral kidneys. No definite evidence of nephrolithiasis on this postcontrast examination. No discrete renal lesions. No urine obstruction or perinephric stranding. Normal appearance the  bilateral adrenal glands. Normal appearance of the urinary bladder given degree of distention. Stomach/Bowel: There is apparent circumferential wall thickening involving the proximal jejunum (best seen on coronal image 36, series 5) without evidence of enteric obstruction. No additional areas of discrete bowel wall thickening are identified though note, evaluation is somewhat degraded secondary to lack significant mesenteric fat. Moderate colonic stool burden without evidence of enteric obstruction. Normal appearance of the terminal ileum and the retrocecal appendix. No pneumoperitoneum, pneumatosis or portal venous gas. No significant hiatal hernia.  Vascular/Lymphatic: Minimal amount of mixed calcified and noncalcified atherosclerotic plaque within normal caliber abdominal aorta, not resulting in hemodynamically significant stenosis. The major branch vessels of the abdominal aorta appear patent on this non CTA examination. Incidental note is made of a retroaortic left renal vein. No definitive bulky retroperitoneal, mesenteric, pelvic or inguinal lymphadenopathy, though again, evaluation degraded secondary lack of mesenteric fat. Reproductive: Normal appearance of the prostate. Several phleboliths are seen with the lower pelvis bilaterally. No free fluid within the pelvic cul-de-sac. Other: Regional soft tissues appear normal. Musculoskeletal: No acute or aggressive osseous abnormalities. Mild degenerative change the bilateral hips with joint space loss, subchondral sclerosis and osteophytosis. No evidence of avascular necrosis. IMPRESSION: 1. Apparent circumferential wall thickening involving the proximal jejunum without evidence of enteric obstruction, nonspecific though could be seen in the setting of enteritis. 2. Otherwise, no explanation for patient's nausea and vomiting. Specifically, no evidence of enteric or urinary obstruction. Normal appearance of the appendix. 3. Aortic Atherosclerosis (ICD10-I70.0). Electronically Signed   By: Simonne Come M.D.   On: 09/27/2020 13:29    ____________________________________________    PROCEDURES  Procedure(s) performed:    Procedures    Medications  sodium chloride 0.9 % bolus 1,000 mL (1,000 mLs Intravenous New Bag/Given 09/27/20 1255)  alum & mag hydroxide-simeth (MAALOX/MYLANTA) 200-200-20 MG/5ML suspension 30 mL (30 mLs Oral Given 09/27/20 1256)    And  lidocaine (XYLOCAINE) 2 % viscous mouth solution 15 mL (15 mLs Oral Given 09/27/20 1256)  ondansetron (ZOFRAN) injection 4 mg (4 mg Intravenous Given 09/27/20 1255)  iohexol (OMNIPAQUE) 300 MG/ML solution 100 mL (100 mLs Intravenous Contrast Given  09/27/20 1313)     ____________________________________________   INITIAL IMPRESSION / ASSESSMENT AND PLAN / ED COURSE  Pertinent labs & imaging results that were available during my care of the patient were reviewed by me and considered in my medical decision making (see chart for details).  Review of the [place] CSRS was performed in accordance of the [facility] prior to dispensing any controlled drugs.           Patient's diagnosis is consistent with gastric ulcer.  Patient presented to emergency department complaining of epigastric pain, nausea, some hematic emesis.  Patient states that he has a history of gastric ulcers, this feels similar.  Patient has had some hematic emesis which is concerned him.  He supposed to be on medications daily for his ulcers but states that after taking it for a long time he did not have any more symptoms and he has stopped at this daily medication.  Patient is unsure what the name of the medicine was.  He was tender to palpation in the epigastric region.  Labs are reassuring.  Imaging revealed some mild enteritis in the jejunum but based off patient's symptoms I feel that patient likely has an exacerbation of his gastric ulcers.  Patiently placed on omeprazole, Phenergan and a GI cocktail at home.  Follow-up with GI.Marland Kitchen  Return precautions discussed  with the patient at this time.  Patient is given ED precautions to return to the ED for any worsening or new symptoms.     ____________________________________________  FINAL CLINICAL IMPRESSION(S) / ED DIAGNOSES  Final diagnoses:  Chronic gastric ulcer with hemorrhage      NEW MEDICATIONS STARTED DURING THIS VISIT:  ED Discharge Orders         Ordered    omeprazole (PRILOSEC) 10 MG capsule  Daily        09/27/20 1359    promethazine (PHENERGAN) 25 MG tablet  Every 6 hours PRN        09/27/20 1359    Alum & Mag Hydroxide-Simeth (GI COCKTAIL) SUSP suspension  2 times daily PRN        09/27/20 1359               This chart was dictated using voice recognition software/Dragon. Despite best efforts to proofread, errors can occur which can change the meaning. Any change was purely unintentional.    Racheal Patches, PA-C 09/27/20 1401    Merwyn Katos, MD 09/27/20 [phone]

## 2020-11-13 ENCOUNTER — Encounter (HOSPITAL_COMMUNITY): Payer: Self-pay

## 2020-11-13 ENCOUNTER — Other Ambulatory Visit: Payer: Self-pay

## 2020-11-13 ENCOUNTER — Inpatient Hospital Stay (HOSPITAL_COMMUNITY)
Admission: EM | Admit: 2020-11-13 | Discharge: 2020-11-16 | DRG: 200 | Disposition: A | Discharge status: Home / Self Care | Payer: Self-pay | Attending: General Surgery | Admitting: General Surgery

## 2020-11-13 ENCOUNTER — Emergency Department (HOSPITAL_COMMUNITY): Payer: Self-pay

## 2020-11-13 ENCOUNTER — Emergency Department
Admission: EM | Admit: 2020-11-13 | Discharge: 2020-11-13 | Payer: Self-pay | Attending: Emergency Medicine | Admitting: Emergency Medicine

## 2020-11-13 ENCOUNTER — Encounter: Payer: Self-pay | Admitting: Emergency Medicine

## 2020-11-13 ENCOUNTER — Emergency Department: Payer: Self-pay

## 2020-11-13 DIAGNOSIS — S2231XA Fracture of one rib, right side, initial encounter for closed fracture: Secondary | ICD-10-CM | POA: Diagnosis present

## 2020-11-13 DIAGNOSIS — Z8711 Personal history of peptic ulcer disease: Secondary | ICD-10-CM

## 2020-11-13 DIAGNOSIS — M542 Cervicalgia: Secondary | ICD-10-CM | POA: Insufficient documentation

## 2020-11-13 DIAGNOSIS — J939 Pneumothorax, unspecified: Secondary | ICD-10-CM | POA: Diagnosis present

## 2020-11-13 DIAGNOSIS — S270XXA Traumatic pneumothorax, initial encounter: Secondary | ICD-10-CM | POA: Insufficient documentation

## 2020-11-13 DIAGNOSIS — W19XXXA Unspecified fall, initial encounter: Secondary | ICD-10-CM

## 2020-11-13 DIAGNOSIS — Y9301 Activity, walking, marching and hiking: Secondary | ICD-10-CM | POA: Insufficient documentation

## 2020-11-13 DIAGNOSIS — K219 Gastro-esophageal reflux disease without esophagitis: Secondary | ICD-10-CM | POA: Diagnosis present

## 2020-11-13 DIAGNOSIS — F1290 Cannabis use, unspecified, uncomplicated: Secondary | ICD-10-CM | POA: Diagnosis present

## 2020-11-13 DIAGNOSIS — Z20822 Contact with and (suspected) exposure to covid-19: Secondary | ICD-10-CM | POA: Insufficient documentation

## 2020-11-13 DIAGNOSIS — Y92009 Unspecified place in unspecified non-institutional (private) residence as the place of occurrence of the external cause: Secondary | ICD-10-CM | POA: Insufficient documentation

## 2020-11-13 DIAGNOSIS — Y9302 Activity, running: Secondary | ICD-10-CM | POA: Diagnosis present

## 2020-11-13 DIAGNOSIS — T797XXA Traumatic subcutaneous emphysema, initial encounter: Secondary | ICD-10-CM | POA: Diagnosis present

## 2020-11-13 DIAGNOSIS — F17210 Nicotine dependence, cigarettes, uncomplicated: Secondary | ICD-10-CM | POA: Diagnosis present

## 2020-11-13 DIAGNOSIS — W1839XA Other fall on same level, initial encounter: Secondary | ICD-10-CM | POA: Insufficient documentation

## 2020-11-13 DIAGNOSIS — W01198A Fall on same level from slipping, tripping and stumbling with subsequent striking against other object, initial encounter: Secondary | ICD-10-CM | POA: Diagnosis present

## 2020-11-13 DIAGNOSIS — Z4682 Encounter for fitting and adjustment of non-vascular catheter: Secondary | ICD-10-CM

## 2020-11-13 DIAGNOSIS — Z880 Allergy status to penicillin: Secondary | ICD-10-CM

## 2020-11-13 HISTORY — DX: Pneumothorax, unspecified: J93.9

## 2020-11-13 LAB — CBC WITH DIFFERENTIAL/PLATELET
Abs Immature Granulocytes: 0.03 10*3/uL (ref 0.00–0.07)
Basophils Absolute: 0 10*3/uL (ref 0.0–0.1)
Basophils Relative: 0 %
Eosinophils Absolute: 0 10*3/uL (ref 0.0–0.5)
Eosinophils Relative: 0 %
HCT: 46.7 % (ref 39.0–52.0)
Hemoglobin: 15.5 g/dL (ref 13.0–17.0)
Immature Granulocytes: 0 %
Lymphocytes Relative: 17 %
Lymphs Abs: 1.9 10*3/uL (ref 0.7–4.0)
MCH: 28.2 pg (ref 26.0–34.0)
MCHC: 33.2 g/dL (ref 30.0–36.0)
MCV: 85.1 fL (ref 80.0–100.0)
Monocytes Absolute: 1.1 10*3/uL — ABNORMAL HIGH (ref 0.1–1.0)
Monocytes Relative: 10 %
Neutro Abs: 8.1 10*3/uL — ABNORMAL HIGH (ref 1.7–7.7)
Neutrophils Relative %: 73 %
Platelets: 175 10*3/uL (ref 150–400)
RBC: 5.49 MIL/uL (ref 4.22–5.81)
RDW: 13.6 % (ref 11.5–15.5)
WBC: 11.2 10*3/uL — ABNORMAL HIGH (ref 4.0–10.5)
nRBC: 0 % (ref 0.0–0.2)

## 2020-11-13 LAB — BASIC METABOLIC PANEL
Anion gap: 10 (ref 5–15)
BUN: 17 mg/dL (ref 6–20)
CO2: 23 mmol/L (ref 22–32)
Calcium: 8.8 mg/dL — ABNORMAL LOW (ref 8.9–10.3)
Chloride: 102 mmol/L (ref 98–111)
Creatinine, Ser: 0.98 mg/dL (ref 0.61–1.24)
GFR, Estimated: >60 mL/min (ref >60)
Glucose, Bld: 117 mg/dL — ABNORMAL HIGH (ref 70–99)
Potassium: 4 mmol/L (ref 3.5–5.1)
Sodium: 135 mmol/L (ref 135–145)

## 2020-11-13 LAB — HIV ANTIBODY (ROUTINE TESTING W REFLEX): HIV Screen 4th Generation wRfx: NONREACTIVE

## 2020-11-13 LAB — RESP PANEL BY RT-PCR (FLU A&B, COVID) ARPGX2
Influenza A by PCR: NEGATIVE
Influenza B by PCR: NEGATIVE
SARS Coronavirus 2 by RT PCR: NEGATIVE

## 2020-11-13 LAB — PROTIME-INR
INR: 1.1 (ref 0.8–1.2)
Prothrombin Time: 13.4 seconds (ref 11.4–15.2)

## 2020-11-13 MED ORDER — MORPHINE SULFATE (PF) 4 MG/ML IV SOLN
4.0000 mg | Freq: Once | INTRAVENOUS | Status: AC
  Administered 2020-11-13: 4 mg via INTRAVENOUS
  Filled 2020-11-13: qty 1

## 2020-11-13 MED ORDER — PANTOPRAZOLE SODIUM 40 MG PO TBEC
40.0000 mg | DELAYED_RELEASE_TABLET | Freq: Every day | ORAL | Status: DC
  Administered 2020-11-14 – 2020-11-16 (×3): 40 mg via ORAL
  Filled 2020-11-13 (×3): qty 1

## 2020-11-13 MED ORDER — LORAZEPAM 2 MG/ML IJ SOLN: 1.0000 mg | INTRAMUSCULAR | Status: DC | PRN

## 2020-11-13 MED ORDER — METOPROLOL TARTRATE 5 MG/5ML IV SOLN: 5.0000 mg | Freq: Four times a day (QID) | INTRAVENOUS | Status: DC | PRN

## 2020-11-13 MED ORDER — ONDANSETRON HCL 4 MG/2ML IJ SOLN: 4.0000 mg | Freq: Four times a day (QID) | INTRAMUSCULAR | Status: DC | PRN

## 2020-11-13 MED ORDER — LIDOCAINE HCL (PF) 1 % IJ SOLN
10.0000 mL | Freq: Once | INTRAMUSCULAR | Status: AC
  Administered 2020-11-13: 10 mL
  Filled 2020-11-13: qty 10

## 2020-11-13 MED ORDER — THIAMINE HCL 100 MG PO TABS
100.0000 mg | ORAL_TABLET | Freq: Every day | ORAL | Status: DC
  Administered 2020-11-13 – 2020-11-16 (×4): 100 mg via ORAL
  Filled 2020-11-13 (×4): qty 1

## 2020-11-13 MED ORDER — METHOCARBAMOL 750 MG PO TABS
750.0000 mg | ORAL_TABLET | Freq: Four times a day (QID) | ORAL | Status: DC
  Administered 2020-11-13 – 2020-11-16 (×13): 750 mg via ORAL
  Filled 2020-11-13 (×13): qty 1

## 2020-11-13 MED ORDER — ADULT MULTIVITAMIN W/MINERALS CH
1.0000 | ORAL_TABLET | Freq: Every day | ORAL | Status: DC
  Administered 2020-11-13 – 2020-11-16 (×4): 1 via ORAL
  Filled 2020-11-13 (×4): qty 1

## 2020-11-13 MED ORDER — IBUPROFEN 400 MG PO TABS: 600.0000 mg | ORAL_TABLET | Freq: Four times a day (QID) | ORAL | Status: DC

## 2020-11-13 MED ORDER — SODIUM CHLORIDE 0.9% FLUSH
3.0000 mL | Freq: Two times a day (BID) | INTRAVENOUS | Status: DC
  Administered 2020-11-13 – 2020-11-16 (×6): 3 mL via INTRAVENOUS

## 2020-11-13 MED ORDER — FOLIC ACID 1 MG PO TABS
1.0000 mg | ORAL_TABLET | Freq: Every day | ORAL | Status: DC
  Administered 2020-11-13 – 2020-11-16 (×4): 1 mg via ORAL
  Filled 2020-11-13 (×4): qty 1

## 2020-11-13 MED ORDER — HYDROMORPHONE HCL 1 MG/ML IJ SOLN
1.0000 mg | INTRAMUSCULAR | Status: DC | PRN
Start: 2020-11-13 — End: 2020-11-14
  Administered 2020-11-13 (×3): 1 mg via INTRAVENOUS
  Filled 2020-11-13 (×3): qty 1

## 2020-11-13 MED ORDER — PANTOPRAZOLE SODIUM 40 MG IV SOLR
40.0000 mg | Freq: Every day | INTRAVENOUS | Status: DC
  Administered 2020-11-13: 40 mg via INTRAVENOUS
  Filled 2020-11-13: qty 40

## 2020-11-13 MED ORDER — ONDANSETRON HCL 4 MG/2ML IJ SOLN
4.0000 mg | Freq: Four times a day (QID) | INTRAMUSCULAR | Status: DC | PRN
  Administered 2020-11-13: 4 mg via INTRAVENOUS
  Filled 2020-11-13: qty 2

## 2020-11-13 MED ORDER — OXYCODONE HCL 5 MG PO TABS
5.0000 mg | ORAL_TABLET | ORAL | Status: DC | PRN
  Administered 2020-11-13 – 2020-11-16 (×14): 5 mg via ORAL
  Filled 2020-11-13 (×14): qty 1

## 2020-11-13 MED ORDER — METHOCARBAMOL 500 MG PO TABS: 750.0000 mg | ORAL_TABLET | Freq: Four times a day (QID) | ORAL | Status: DC | PRN

## 2020-11-13 MED ORDER — SODIUM CHLORIDE 0.9% FLUSH
3.0000 mL | INTRAVENOUS | Status: DC | PRN
Start: 2020-11-13 — End: 2020-11-16
  Administered 2020-11-15: 3 mL via INTRAVENOUS

## 2020-11-13 MED ORDER — ONDANSETRON 4 MG PO TBDP: 4.0000 mg | ORAL_TABLET | Freq: Four times a day (QID) | ORAL | Status: DC | PRN

## 2020-11-13 MED ORDER — THIAMINE HCL 100 MG/ML IJ SOLN: 100.0000 mg | Freq: Every day | INTRAMUSCULAR | Status: DC

## 2020-11-13 MED ORDER — FENTANYL CITRATE (PF) 100 MCG/2ML IJ SOLN
50.0000 ug | Freq: Once | INTRAMUSCULAR | Status: AC
  Administered 2020-11-13: 50 ug via INTRAVENOUS
  Filled 2020-11-13: qty 2

## 2020-11-13 MED ORDER — ENOXAPARIN SODIUM 30 MG/0.3ML ~~LOC~~ SOLN
30.0000 mg | Freq: Two times a day (BID) | SUBCUTANEOUS | Status: DC
  Administered 2020-11-14 – 2020-11-16 (×5): 30 mg via SUBCUTANEOUS
  Filled 2020-11-13 (×6): qty 0.3

## 2020-11-13 MED ORDER — ACETAMINOPHEN 500 MG PO TABS
1000.0000 mg | ORAL_TABLET | Freq: Four times a day (QID) | ORAL | Status: DC
  Administered 2020-11-13 – 2020-11-16 (×11): 1000 mg via ORAL
  Filled 2020-11-13 (×13): qty 2

## 2020-11-13 MED ORDER — ONDANSETRON HCL 4 MG/2ML IJ SOLN
4.0000 mg | Freq: Once | INTRAMUSCULAR | Status: AC
  Administered 2020-11-13: 4 mg via INTRAVENOUS
  Filled 2020-11-13: qty 2

## 2020-11-13 MED ORDER — SODIUM CHLORIDE 0.9 % IV SOLN: 250.0000 mL | INTRAVENOUS | Status: DC | PRN

## 2020-11-13 MED ORDER — LORAZEPAM 1 MG PO TABS: 1.0000 mg | ORAL_TABLET | ORAL | Status: DC | PRN

## 2020-11-13 NOTE — ED Notes (Signed)
Dr Don Perking reports pt needs exam room for pneumothorax and poss CT; acuity level changed, charge nurse notified and bed requested

## 2020-11-13 NOTE — ED Notes (Signed)
[phone] Trudee Kuster - pt's sister updated with pt's consent.

## 2020-11-13 NOTE — ED Provider Notes (Signed)
Emergency Department Provider Note   I have reviewed the triage vital signs and the nursing notes.   HISTORY  Chief Complaint Chest Injury   HPI Eric Mcfarland is a 40 y.o. male w/ PMH reviewed below presents to the ED as a transfer from [facility] with right sided PNX after chest wall trauma.  Patient tells me he was running into his house to get out of the rain when he tripped and fell landing against the brick steps striking his right chest.  Initially had soreness and trended morning till yesterday when he became more short of breath which ultimately prompted his ED presentation to [facility].  He was found to have a pneumothorax on the right. Patient had chest tube placed at [facility] and transferred here for higher level of care.   Patient reports severe right chest pain but breathing improved. No other area of pain or injury.   Past Medical History:  Diagnosis Date  . GERD (gastroesophageal reflux disease)   . Reflux     There are no problems to display for this patient.   History reviewed. No pertinent surgical history.  Allergies Penicillins  History reviewed. No pertinent family history.  Social History Social History   Tobacco Use  . Smoking status: Current Every Day Smoker    Packs/day: 1.00    Types: Cigarettes  . Smokeless tobacco: Never Used  Vaping Use  . Vaping Use: Never used  Substance Use Topics  . Alcohol use: Yes  . Drug use: Yes    Types: Marijuana    Comment: last smoked 4-5 days ago    Review of Systems  Constitutional: No fever/chills Eyes: No visual changes. ENT: No sore throat. Cardiovascular: Positive right chest pain. Respiratory: Improved shortness of breath. Gastrointestinal: No abdominal pain.  No nausea, no vomiting.  No diarrhea.  No constipation. Genitourinary: Negative for dysuria. Musculoskeletal: Negative for back pain. Skin: Negative for rash. Neurological: Negative for headaches, focal weakness or  numbness.  10-point ROS otherwise negative.  ____________________________________________   PHYSICAL EXAM:  VITAL SIGNS: Vitals:   11/13/20 0600 11/13/20 0615  BP: (!) 142/89 (!) 139/96  Pulse: (!) 58 (!) 57  Resp: 16 20  SpO2: 100% 100%    Constitutional: Alert and oriented. Well appearing and in no acute distress. Eyes: Conjunctivae are normal.  Head: Atraumatic. Nose: No congestion/rhinnorhea. Mouth/Throat: Mucous membranes are moist.   Neck: No stridor.  Cardiovascular: Normal rate, regular rhythm. Good peripheral circulation. Grossly normal heart sounds.   Respiratory: Normal respiratory effort. Chest tube on the right in good position and secured in place.  Gastrointestinal: No distention.  Musculoskeletal: No gross deformities of extremities. Neurologic:  Normal speech and language.  Skin:  Skin is warm, dry and intact. No rash noted.  ____________________________________________   LABS (all labs ordered are listed, but only abnormal results are displayed)  Labs Reviewed  BASIC METABOLIC PANEL  CBC WITH DIFFERENTIAL/PLATELET  PROTIME-INR   ____________________________________________  RADIOLOGY  DG Ribs Unilateral W/Chest Right  Result Date: 11/13/2020 CLINICAL DATA:  Right lateral rib pain, pop after cough EXAM: RIGHT RIBS AND CHEST - 3+ VIEW COMPARISON:  Radiograph 11/07/2016 FINDINGS: Minimally displaced fracture of the posterolateral right ninth rib. Moderate volume right pneumothorax occupying approximately 20-30% of the chest cavity. Medial opacities in the right lung are favored to reflect some atelectatic change. Left lung is clear without left pneumothorax or effusion. Mediastinal contours are unremarkable without significant mediastinal shift. No other acute osseous or soft tissue  abnormality. IMPRESSION: Right ninth rib fracture. Moderate volume right pneumothorax occupying approximately 20-30% of the chest cavity. Trace blunting of the costophrenic  sulcus could reflect some minimal pleural fluid/hemothorax. No significant mediastinal shift. Critical Value/emergent results were called by telephone at the time of interpretation on 11/13/2020 at 12:58 am to provider [facility] , who verbally acknowledged these results. Electronically Signed   By: Kreg Shropshire M.D.   On: 11/13/2020 00:56   CT Cervical Spine Wo Contrast  Result Date: 11/13/2020 CLINICAL DATA:  40 year old male with neck trauma. EXAM: CT CERVICAL SPINE WITHOUT CONTRAST TECHNIQUE: Multidetector CT imaging of the cervical spine was performed without intravenous contrast. Multiplanar CT image reconstructions were also generated. COMPARISON:  None. FINDINGS: Alignment: No acute subluxation. There is mild reversal of normal cervical lordosis which may be positional or due to muscle spasm. Skull base and vertebrae: No acute fracture. Soft tissues and spinal canal: No prevertebral fluid or swelling. No visible canal hematoma. Disc levels: No acute findings. No significant degenerative changes. Upper chest: Biapical subpleural blebs. Partially visualized small right pneumothorax. A chest tube is seen in the right apex. Other: None IMPRESSION: 1. No acute/traumatic cervical spine pathology. 2. Partially visualized small right apical pneumothorax and chest tube. Electronically Signed   By: Elgie Collard M.D.   On: 11/13/2020 03:26   DG Chest Portable 1 View  Result Date: 11/13/2020 CLINICAL DATA:  Fall with chest tube EXAM: PORTABLE CHEST 1 VIEW COMPARISON:  11/13/2020 FINDINGS: Apically directed right chest tube. Suspected residual right apical pneumothorax is less well visualized on this exam. There is some persistent right basilar opacity likely reflecting combination of layering pleural effusion and adjacent passive atelectasis. Small amount of gas along the right chest wall. Redemonstration of the right ninth rib fracture. Additional atelectatic changes are noted in the left lung which  is otherwise clear. Stable cardiomediastinal contours. No other acute osseous or soft tissue abnormalities. Telemetry leads overlie the chest. IMPRESSION: 1. Apically directed right chest tube with without visible residual right apical pneumothorax. 2. Persistent and slightly increasing right basilar opacity likely reflecting combination of layering pleural effusion and adjacent passive atelectasis. 3. Redemonstrated right ninth rib fracture. Subcutaneous emphysema across the right chest wall. Electronically Signed   By: Kreg Shropshire M.D.   On: 11/13/2020 06:16   DG Chest Portable 1 View  Result Date: 11/13/2020 CLINICAL DATA:  Chest tube placement EXAM: PORTABLE CHEST 1 VIEW COMPARISON:  Radiograph 11/13/2020 FINDINGS: Interval placement of an apically directed right chest tube with only a trace residual apical pneumothorax. Partial re-expansion of the right lung with some residual bandlike opacities in the right lung base favoring subsegmental atelectasis given elevation of the right hemidiaphragm. Blunting of the costophrenic sulcus could reflect trace pleural fluid. Left lung is clear. Cardiomediastinal contours are unremarkable. No other acute osseous or soft tissue abnormality. Telemetry leads overlie the chest. IMPRESSION: 1. Interval placement of an apically directed right chest tube with only a trace residual apical pneumothorax and basilar pleural fluid. 2. Partial re-expansion of the right lung with some residual bandlike opacities in the right lung base favoring subsegmental atelectasis. Electronically Signed   By: Kreg Shropshire M.D.   On: 11/13/2020 01:58    ____________________________________________   PROCEDURES  Procedure(s) performed:   Procedures  None  ____________________________________________   INITIAL IMPRESSION / ASSESSMENT AND PLAN / ED COURSE  Pertinent labs & imaging results that were available during my care of the patient were reviewed by me and considered in  my  medical decision making (see chart for details).   Patient arrives as a transfer from [facility] w/ PNX found on evaluation there for chest wall trauma.  Reviewed the images from the outside emergency department including plain films after chest tube placement.  Have ordered pain medication along with baseline labs.  COVID and flu are negative from [facility]. Case discussed with Dr. Andrey Campanile with trauma per outside note. Will discuss with trauma.   Discussed patient's case with Trauma Surgery, Dr. Andrey Campanile to request admission. Patient and family (if present) updated with plan. Care transferred to Trauma service.  I reviewed all nursing notes, vitals, pertinent old records, EKGs, labs, imaging (as available).   ____________________________________________  FINAL CLINICAL IMPRESSION(S) / ED DIAGNOSES  Final diagnoses:  Pneumothorax on right    MEDICATIONS GIVEN DURING THIS VISIT:  Medications  HYDROmorphone (DILAUDID) injection 1 mg (1 mg Intravenous Given 11/13/20 0624)  ondansetron (ZOFRAN) injection 4 mg (4 mg Intravenous Given 11/13/20 8315)    Note:  This document was prepared using Dragon voice recognition software and may include unintentional dictation errors.  Alona Bene, MD, [facility] Emergency Medicine    Baby Gieger, Arlyss Repress, MD 11/13/20 [phone]

## 2020-11-13 NOTE — ED Notes (Addendum)
Dr. Don Perking discussed chest tube pt with placement. Consent form signed. Pt alert and agreeable with procedure. Pt on 2L supplemental O2, placed on all monitors.

## 2020-11-13 NOTE — ED Notes (Signed)
Dr. Don Perking updated pt on possible need to transfer to [facility] in [place], pt given phone to update family. Pt denies any other needs at this time.

## 2020-11-13 NOTE — ED Provider Notes (Addendum)
[facility] Emergency Department Provider Note  ____________________________________________  Time seen: Approximately 3:15 AM  I have reviewed the triage vital signs and the nursing notes.   HISTORY  Chief Complaint Rib Injury   HPI Eric Mcfarland is a 40 y.o. male with a history of peptic ulcer disease and GERD who presents for evaluation of chest pain, shortness of breath, neck pain status post fall.  Patient reports that he was walking towards the door when he lost his balance and fell onto brick steps on the entrance of his house.  He hit the right side of his neck and the right side of his rib cage.  Is complaining of severe pain on the rib cage associated with shortness of breath.  The pain is pleuritic in nature and constant since the fall.  Is also complaining of mild right-sided neck pain.  He denies head trauma or LOC.  Does not take any blood thinners.  No back pain or extremity pain.  Patient reports that the fall was mechanical in nature.  He denies any alcohol or drugs today   Past Medical History:  Diagnosis Date  . GERD (gastroesophageal reflux disease)   . Reflux     There are no problems to display for this patient.   History reviewed. No pertinent surgical history.  Prior to Admission medications   Medication Sig Start Date End Date Taking? Authorizing Provider  Alum & Mag Hydroxide-Simeth (GI COCKTAIL) SUSP suspension Take 30 mLs by mouth 2 (two) times daily as needed (epigastric pain). Shake well. 09/27/20  Yes Cuthriell, Delorise Royals, PA-C  omeprazole (PRILOSEC) 10 MG capsule Take 1 capsule (10 mg total) by mouth daily. 09/27/20  Yes Cuthriell, Delorise Royals, PA-C  promethazine (PHENERGAN) 25 MG tablet Take 1 tablet (25 mg total) by mouth every 6 (six) hours as needed for nausea or vomiting. 09/27/20  Yes Cuthriell, Delorise Royals, PA-C    Allergies Penicillins  No family history on file.  Social History Social History   Tobacco Use   . Smoking status: Current Every Day Smoker    Packs/day: 1.00    Types: Cigarettes  . Smokeless tobacco: Never Used  Vaping Use  . Vaping Use: Never used  Substance Use Topics  . Alcohol use: Yes  . Drug use: Yes    Types: Marijuana    Comment: last smoked 4-5 days ago    Review of Systems  Constitutional: Negative for fever. Eyes: Negative for visual changes. ENT: Negative for facial injury. + R neck pain Cardiovascular: + chest pain Respiratory: + shortness of breath. Gastrointestinal: Negative for abdominal pain or injury. Genitourinary: Negative for dysuria. Musculoskeletal: Negative for back injury, negative for arm or leg pain. Skin: Negative for laceration/abrasions. Neurological: Negative for head injury.   ____________________________________________   PHYSICAL EXAM:  VITAL SIGNS: ED Triage Vitals  Enc Vitals Group     BP 11/13/20 0041 133/88     Pulse Rate 11/13/20 0041 87     Resp 11/13/20 0041 18     Temp 11/13/20 0041 98 F (36.7 C)     Temp Source 11/13/20 0041 Oral     SpO2 11/13/20 0037 98 %     Weight 11/13/20 0041 130 lb (59 kg)     Height 11/13/20 0041 5\' 5"  (1.651 m)     Head Circumference --      Peak Flow --      Pain Score 11/13/20 0041 10     Pain Loc --  Pain Edu? --      Excl. in GC? --     Full spinal precautions maintained throughout the trauma exam. Constitutional: Alert and oriented. No acute distress. Does not appear intoxicated. HEENT Head: Normocephalic and atraumatic. Face: No facial bony tenderness. Stable midface Ears: No hemotympanum bilaterally. No Battle sign Eyes: No eye injury. PERRL. No raccoon eyes Nose: Nontender. No epistaxis. No rhinorrhea Mouth/Throat: Mucous membranes are moist. No oropharyngeal blood. No dental injury. Airway patent without stridor. Normal voice. Neck: no C-collar. No midline c-spine tenderness.  Cardiovascular: Normal rate, regular rhythm. Normal and symmetric distal pulses are  present in all extremities. Pulmonary/Chest: Chest wall is tender to palpation on the right side with no crepitus, decreased breath sounds on the right and normal on the left.  Abdominal: Soft, nontender, non distended. Musculoskeletal: Nontender with normal full range of motion in all extremities. No deformities. No thoracic or lumbar midline spinal tenderness. Pelvis is stable. Skin: Skin is warm, dry and intact. No abrasions or contutions. Psychiatric: Speech and behavior are appropriate. Neurological: Normal speech and language. Moves all extremities to command. No gross focal neurologic deficits are appreciated.  Glascow Coma Score: 4 - Opens eyes on own 6 - Follows simple motor commands 5 - Alert and oriented GCS: 15   ____________________________________________   LABS (all labs ordered are listed, but only abnormal results are displayed)  Labs Reviewed  RESP PANEL BY RT-PCR (FLU A&B, COVID) [id number]   ____________________________________________  EKG  none  ____________________________________________  RADIOLOGY  I have personally reviewed the images performed during this visit and I agree with the Radiologist's read.   Interpretation by Radiologist:  DG Ribs Unilateral W/Chest Right  Result Date: 11/13/2020 CLINICAL DATA:  Right lateral rib pain, pop after cough EXAM: RIGHT RIBS AND CHEST - 3+ VIEW COMPARISON:  Radiograph 11/07/2016 FINDINGS: Minimally displaced fracture of the posterolateral right ninth rib. Moderate volume right pneumothorax occupying approximately 20-30% of the chest cavity. Medial opacities in the right lung are favored to reflect some atelectatic change. Left lung is clear without left pneumothorax or effusion. Mediastinal contours are unremarkable without significant mediastinal shift. No other acute osseous or soft tissue abnormality. IMPRESSION: Right ninth rib fracture. Moderate volume right pneumothorax occupying approximately 20-30% of the  chest cavity. Trace blunting of the costophrenic sulcus could reflect some minimal pleural fluid/hemothorax. No significant mediastinal shift. Critical Value/emergent results were called by telephone at the time of interpretation on 11/13/2020 at 12:58 am to [facility] , who verbally acknowledged these results. Electronically Signed   By: Kreg ShropshirePrice  DeHay M.D.   On: 11/13/2020 00:56   CT Cervical Spine Wo Contrast  Result Date: 11/13/2020 CLINICAL DATA:  40 year old male with neck trauma. EXAM: CT CERVICAL SPINE WITHOUT CONTRAST TECHNIQUE: Multidetector CT imaging of the cervical spine was performed without intravenous contrast. Multiplanar CT image reconstructions were also generated. COMPARISON:  None. FINDINGS: Alignment: No acute subluxation. There is mild reversal of normal cervical lordosis which may be positional or due to muscle spasm. Skull base and vertebrae: No acute fracture. Soft tissues and spinal canal: No prevertebral fluid or swelling. No visible canal hematoma. Disc levels: No acute findings. No significant degenerative changes. Upper chest: Biapical subpleural blebs. Partially visualized small right pneumothorax. A chest tube is seen in the right apex. Other: None IMPRESSION: 1. No acute/traumatic cervical spine pathology. 2. Partially visualized small right apical pneumothorax and chest tube. Electronically Signed   By: Elgie CollardArash  Radparvar M.D.   On: 11/13/2020 03:26  DG Chest Portable 1 View  Result Date: 11/13/2020 CLINICAL DATA:  Chest tube placement EXAM: PORTABLE CHEST 1 VIEW COMPARISON:  Radiograph 11/13/2020 FINDINGS: Interval placement of an apically directed right chest tube with only a trace residual apical pneumothorax. Partial re-expansion of the right lung with some residual bandlike opacities in the right lung base favoring subsegmental atelectasis given elevation of the right hemidiaphragm. Blunting of the costophrenic sulcus could reflect trace pleural fluid. Left  lung is clear. Cardiomediastinal contours are unremarkable. No other acute osseous or soft tissue abnormality. Telemetry leads overlie the chest. IMPRESSION: 1. Interval placement of an apically directed right chest tube with only a trace residual apical pneumothorax and basilar pleural fluid. 2. Partial re-expansion of the right lung with some residual bandlike opacities in the right lung base favoring subsegmental atelectasis. Electronically Signed   By: Kreg Shropshire M.D.   On: 11/13/2020 01:58     ____________________________________________   PROCEDURES  Procedure(s) performed:yes CHEST TUBE INSERTION  Date/Time: 11/13/2020 3:18 AM Performed by: Nita Sickle, MD Authorized by: Nita Sickle, MD   Consent:    Consent obtained:  Written   Consent given by:  Patient   Risks, benefits, and alternatives were discussed: yes     Risks discussed:  Bleeding, incomplete drainage, nerve damage, pain, infection and damage to surrounding structures   Alternatives discussed:  Observation Universal protocol:    Procedure explained and questions answered to patient or proxy's satisfaction: yes     Relevant documents present and verified: yes     Test results available: yes     Imaging studies available: yes     Site/side marked: yes     Immediately prior to procedure, a time out was called: yes     Patient identity confirmed:  Verbally with patient Pre-procedure details:    Skin preparation:  Chlorhexidine   Preparation: Patient was prepped and draped in the usual sterile fashion   Sedation:    Sedation type:  None Anesthesia:    Anesthesia method:  Local infiltration   Local anesthetic:  Lidocaine 1% w/o epi Procedure details:    Placement location:  R lateral   Scalpel size:  11   Tube size ([place]): 22.   Dissection instrument:  Kelly clamp   Ultrasound guidance: no     Tension pneumothorax: no     Tube connected to:  Suction   Drainage characteristics:  Air only    Suture material:  0 silk   Dressing:  4x4 sterile gauze and petrolatum-impregnated gauze Post-procedure details:    Post-insertion x-ray findings: tube in good position     Procedure completion:  Tolerated .1-3 Lead EKG Interpretation Performed by: Nita Sickle, MD Authorized by: Nita Sickle, MD     Interpretation: normal     ECG rate assessment: normal     Rhythm: sinus rhythm     Ectopy: none     Conduction: normal     Critical Care performed:  None ____________________________________________   INITIAL IMPRESSION / ASSESSMENT AND PLAN / ED COURSE   40 y.o. male with a history of peptic ulcer disease and GERD who presents for evaluation of chest pain, shortness of breath, neck pain status post fall.  X-ray visualized by me showing a right rib fracture with pneumothorax, confirmed by radiology.  Chest tube was placed per procedure note above.  CT of the cervical spine done since patient was complained of neck pain showing no acute traumatic injuries, confirmed by radiology and visualized by me.  Will discuss with the hospitalist for admission.  _________________________ 4:22 AM on 11/13/2020 -----------------------------------------  Consulted hospitalist dr. Arville Care who recommended consulting surgery for admission. Dr. Lady Gary recommended transferring patient to [facility] since [facility] does not have a CT surgeon available. Spoke with Dr. Murlean Iba, trauma surgery at [facility] who accepted patient and recommended ED to ED transfer. Spoke with Dr. Blinda Leatherwood in the ED who agreed with transfer. Patient in agreement to be transferred. Patient remains stable on RA.      ____________________________________________  Please note:  Patient was evaluated in Emergency Department today for the symptoms described in the history of present illness. Patient was evaluated in the context of the global COVID-19 pandemic, which necessitated consideration that the patient might be at risk for  infection with the SARS-CoV-2 virus that causes COVID-19. Institutional protocols and algorithms that pertain to the evaluation of patients at risk for COVID-19 are in a state of rapid change based on information released by regulatory bodies including the CDC and federal and state organizations. These policies and algorithms were followed during the patient's care in the ED.  Some ED evaluations and interventions may be delayed as a result of limited staffing during the pandemic.   ____________________________________________   FINAL CLINICAL IMPRESSION(S) / ED DIAGNOSES   Final diagnoses:  Fall, initial encounter  Traumatic pneumothorax, initial encounter      NEW MEDICATIONS STARTED DURING THIS VISIT:  ED Discharge Orders    None       Note:  This document was prepared using Dragon voice recognition software and may include unintentional dictation errors.    Don Perking, [place], MD 11/13/20 [id number]    Nita Sickle, MD 11/13/20 [phone]

## 2020-11-13 NOTE — ED Notes (Signed)
X-ray at bedside

## 2020-11-13 NOTE — ED Notes (Signed)
PT A&Ox4 at time of departure with EMS. Pt reports improvement in pain and breathing. Chest tube removed from suction and below level of chest when moving and on EMS stretcher, discussed with EMS importance of keeping atrium below level of chest and without kinks in tubing. Pt agreeable to be transported and updated his sister via telephone regarding transfer. Pt denied any needs or questions at time of transfer.

## 2020-11-13 NOTE — ED Notes (Signed)
Consent for transport form signed and given to ED secretary.

## 2020-11-13 NOTE — ED Triage Notes (Signed)
EMS reports pt is from [facility] - Pt had a fall a week ago where he sustained rib fractures. Coughed and felt right sided pain. Diagnosed with a tension pneumothorax.

## 2020-11-13 NOTE — H&P (Addendum)
[facility] Surgery Trauma Admission Note  Eric Mcfarland 1981/01/07  [id number].    Requesting MD: Jacqulyn Bath, MD Chief Complaint/Reason for Consult: Fall, pneumothorax  HPI:  Eric Mcfarland is a 39 y/o M who presented from [facility] due to chest trauma. Pt states he was running into his house in the rain on Tuesday (3/22) when he tripped and fell onto his right side, hitting his chest on brick steps leading up to the house. He denies hitting his head or LOC. States he stayed home on the couch all day Wednesday, tried to rest and take tylenol for pain. Wednesday he reports he went to donate plasma and during the donation felt a pop in his right site, followed by immediate pain and SOB. He presented to [facility] later that day for evaluation. His cc is chest wall pain, worse with movement, and shortness of breath. Workup revealed right sided rib fracture and right pneumothorax. A right sided chest tube was placed at [facility] with improvement in PTX and the patient was transferred to [facility] for trauma service admission.   Pt has a PMH GERD and gastric ulcers, reports a recent history of gastric ulcers one month ago causing him inability to eat/drink and melena. This has improved and he takes daily PPI. Denies use of blood thinners.  Social Hx: lives in [place], was working at [employer] [address] until about a month ago when he developed gastric ulcers and his symptoms kept him out of work. Substance: smokes 1/2 ppd cigarettes, daily marijuana use, reports drinking EtOH every 3-4 days (reports drinking about 1/5 of liquor when he drinks), reports occasional use of ecstasy (weekends), denies cocaine/heroin/opioid use.  ROS: Review of Systems  Constitutional: Negative for chills, diaphoresis and fever.  HENT: Negative.   Eyes: Negative.   Respiratory: Positive for cough and shortness of breath. Negative for hemoptysis and wheezing.   Cardiovascular: Positive for chest pain. Negative  for palpitations, orthopnea and leg swelling.  Gastrointestinal: Positive for constipation (las BM prior to fall, at baseline he reports 2 BMs daily). Negative for abdominal pain, blood in stool, diarrhea, melena, nausea and vomiting.  Genitourinary: Positive for hematuria (reports pink urine yesterday). Negative for dysuria, flank pain, frequency and urgency.  Musculoskeletal: Positive for falls and myalgias. Negative for back pain, joint pain and neck pain.  Skin: Negative.   Neurological: Negative.   Endo/Heme/Allergies: Negative.   Psychiatric/Behavioral: Negative.     History reviewed. No pertinent family history.  Past Medical History:  Diagnosis Date  . GERD (gastroesophageal reflux disease)   . Reflux     History reviewed. No pertinent surgical history.  Social History:  reports that he has been smoking cigarettes. He has been smoking about 1.00 pack per day. He has never used smokeless tobacco. He reports current alcohol use. He reports current drug use. Drug: Marijuana.  Allergies:  Allergies  Allergen Reactions  . Penicillins Swelling    (Not in a hospital admission)   Blood pressure 125/73, pulse (!) 59, resp. rate 17, height 5\' 5"  (1.651 m), weight 59 kg, SpO2 100 %. Physical Exam: Constitutional: NAD; conversant; no deformities Eyes: Moist conjunctiva; no lid lag; anicteric; PERRL Neck: Trachea midline; no thyromegaly Lungs: Normal respiratory effort ORA; taking shallow breaths 2/2 pain, there is some subcutaneous emphysema of the chest wall, R chest tube in place (not properly hooked to suction, I increased suction and fixed wall cannister), no air leak, lungs CTAB, diminished breath sounds bilateral  lung bases, no tactile fremitus CV: RRR; no palpable thrills; no pitting edema GI: Abd soft, non-tender, no palpable hepatosplenomegaly MSK: symmetrical; moving all extremities independently without pain,  no clubbing/cyanosis Psychiatric: Appropriate affect; alert  and oriented x3 Lymphatic: No palpable cervical or axillary lymphadenopathy  Results for orders placed or performed during the hospital encounter of 11/13/20 (from the past 48 hour(s))  Basic metabolic panel     Status: Abnormal   Collection Time: 11/13/20  6:25 AM  Result Value Ref Range   Sodium 135 135 - 145 mmol/L   Potassium 4.0 3.5 - 5.1 mmol/L   Chloride 102 98 - 111 mmol/L   CO2 23 22 - 32 mmol/L   Glucose, Bld 117 (H) 70 - 99 mg/dL    Comment: Glucose reference range applies only to samples taken after fasting for at least 8 hours.   BUN 17 6 - 20 mg/dL   Creatinine, Ser 4.19 0.61 - 1.24 mg/dL   Calcium 8.8 (L) 8.9 - 10.3 mg/dL   GFR, Estimated >37 >90 mL/min    Comment: (NOTE) Calculated using the CKD-EPI Creatinine Equation (2021)    Anion gap 10 5 - 15    Comment: Performed at [facility], [address] [place], [place] [address]  CBC with Differential     Status: Abnormal   Collection Time: 11/13/20  6:25 AM  Result Value Ref Range   WBC 11.2 (H) 4.0 - 10.5 K/uL   RBC 5.49 4.22 - 5.81 MIL/uL   Hemoglobin 15.5 13.0 - 17.0 g/dL   HCT 35.3 29.9 - 24.2 %   MCV 85.1 80.0 - 100.0 fL   MCH 28.2 26.0 - 34.0 pg   MCHC 33.2 30.0 - 36.0 g/dL   RDW 68.3 41.9 - 62.2 %   Platelets 175 150 - 400 K/uL   nRBC 0.0 0.0 - 0.2 %   Neutrophils Relative % 73 %   Neutro Abs 8.1 (H) 1.7 - 7.7 K/uL   Lymphocytes Relative 17 %   Lymphs Abs 1.9 0.7 - 4.0 K/uL   Monocytes Relative 10 %   Monocytes Absolute 1.1 (H) 0.1 - 1.0 K/uL   Eosinophils Relative 0 %   Eosinophils Absolute 0.0 0.0 - 0.5 K/uL   Basophils Relative 0 %   Basophils Absolute 0.0 0.0 - 0.1 K/uL   Immature Granulocytes 0 %   Abs Immature Granulocytes 0.03 0.00 - 0.07 K/uL    Comment: Performed at [facility], [address], [place], [place] [address]  Protime-INR     Status: None   Collection Time: 11/13/20  6:25 AM  Result Value Ref Range   Prothrombin Time 13.4 11.4 - 15.2 seconds   INR 1.1 0.8 -  1.2    Comment: (NOTE) INR goal varies based on device and disease states. Performed at [facility], [address], [place], [place] [address]    DG Ribs Unilateral W/Chest Right  Result Date: 11/13/2020 CLINICAL DATA:  Right lateral rib pain, pop after cough EXAM: RIGHT RIBS AND CHEST - 3+ VIEW COMPARISON:  Radiograph 11/07/2016 FINDINGS: Minimally displaced fracture of the posterolateral right ninth rib. Moderate volume right pneumothorax occupying approximately 20-30% of the chest cavity. Medial opacities in the right lung are favored to reflect some atelectatic change. Left lung is clear without left pneumothorax or effusion. Mediastinal contours are unremarkable without significant mediastinal shift. No other acute osseous or soft tissue abnormality. IMPRESSION: Right ninth rib fracture. Moderate volume right  pneumothorax occupying approximately 20-30% of the chest cavity. Trace blunting of the costophrenic sulcus could reflect some minimal pleural fluid/hemothorax. No significant mediastinal shift. Critical Value/emergent results were called by telephone at the time of interpretation on 11/13/2020 at 12:58 am to provider [facility] , who verbally acknowledged these results. Electronically Signed   By: Kreg ShropshirePrice  DeHay M.D.   On: 11/13/2020 00:56   CT Cervical Spine Wo Contrast  Result Date: 11/13/2020 CLINICAL DATA:  40 year old male with neck trauma. EXAM: CT CERVICAL SPINE WITHOUT CONTRAST TECHNIQUE: Multidetector CT imaging of the cervical spine was performed without intravenous contrast. Multiplanar CT image reconstructions were also generated. COMPARISON:  None. FINDINGS: Alignment: No acute subluxation. There is mild reversal of normal cervical lordosis which may be positional or due to muscle spasm. Skull base and vertebrae: No acute fracture. Soft tissues and spinal canal: No prevertebral fluid or swelling. No visible canal hematoma. Disc levels: No acute findings. No significant  degenerative changes. Upper chest: Biapical subpleural blebs. Partially visualized small right pneumothorax. A chest tube is seen in the right apex. Other: None IMPRESSION: 1. No acute/traumatic cervical spine pathology. 2. Partially visualized small right apical pneumothorax and chest tube. Electronically Signed   By: Elgie CollardArash  Radparvar M.D.   On: 11/13/2020 03:26   DG Chest Portable 1 View  Result Date: 11/13/2020 CLINICAL DATA:  Fall with chest tube EXAM: PORTABLE CHEST 1 VIEW COMPARISON:  11/13/2020 FINDINGS: Apically directed right chest tube. Suspected residual right apical pneumothorax is less well visualized on this exam. There is some persistent right basilar opacity likely reflecting combination of layering pleural effusion and adjacent passive atelectasis. Small amount of gas along the right chest wall. Redemonstration of the right ninth rib fracture. Additional atelectatic changes are noted in the left lung which is otherwise clear. Stable cardiomediastinal contours. No other acute osseous or soft tissue abnormalities. Telemetry leads overlie the chest. IMPRESSION: 1. Apically directed right chest tube with without visible residual right apical pneumothorax. 2. Persistent and slightly increasing right basilar opacity likely reflecting combination of layering pleural effusion and adjacent passive atelectasis. 3. Redemonstrated right ninth rib fracture. Subcutaneous emphysema across the right chest wall. Electronically Signed   By: Kreg ShropshirePrice  DeHay M.D.   On: 11/13/2020 06:16   DG Chest Portable 1 View  Result Date: 11/13/2020 CLINICAL DATA:  Chest tube placement EXAM: PORTABLE CHEST 1 VIEW COMPARISON:  Radiograph 11/13/2020 FINDINGS: Interval placement of an apically directed right chest tube with only a trace residual apical pneumothorax. Partial re-expansion of the right lung with some residual bandlike opacities in the right lung base favoring subsegmental atelectasis given elevation of the right  hemidiaphragm. Blunting of the costophrenic sulcus could reflect trace pleural fluid. Left lung is clear. Cardiomediastinal contours are unremarkable. No other acute osseous or soft tissue abnormality. Telemetry leads overlie the chest. IMPRESSION: 1. Interval placement of an apically directed right chest tube with only a trace residual apical pneumothorax and basilar pleural fluid. 2. Partial re-expansion of the right lung with some residual bandlike opacities in the right lung base favoring subsegmental atelectasis. Electronically Signed   By: Kreg ShropshirePrice  DeHay M.D.   On: 11/13/2020 01:58      Assessment/Plan Ground level fall Right 9th rib FX - diagnosed on CXR, no chest CT performed, multimodal pain control, IS, Pulm toilet Right PTX with subcutaneous emphysema of the right chest wall - s/p [facility]. CXR this AM with resolution PTX. Small amt pleural effusion vs hemothorax on CXR, Continue  CT to -20 cm suction, CXR in AM GERD, PMH gastric ulcers - daily PPI, avoid NSAIDS given recent hx UGIB ?hematuria - check UA, no reported urinary hesitancy, dysuria, pyuria   FEN: Reg diet, Saline lock IV ID: none VTE: SCD's, Lovenox Foley: none Dispo: admit to inpatient, med-surg, IS, PT/OT evals, CXR in AM   Adam Phenix, [facility] Surgery Please see Amion for pager number during day hours 7:00am-4:30pm 11/13/2020, 7:44 AM

## 2020-11-13 NOTE — ED Notes (Signed)
Dressing to chest tube is clean, dry, and intact.

## 2020-11-13 NOTE — ED Notes (Signed)
Dr. Don Perking, Jeannett Senior RN, and this RN present at bedside for procedure.

## 2020-11-13 NOTE — ED Notes (Signed)
Report given to [facility], RN of [phone]

## 2020-11-13 NOTE — ED Triage Notes (Addendum)
EMS brings pt in from home; to lobby via w/c with no distress noted; c/o lateral rt ribcage pain; st "felt a pop after cough" about 6pm; tenderness to palpation, worse with inspiration

## 2020-11-13 NOTE — Evaluation (Signed)
Occupational Therapy Evaluation Patient Details Name: Eric Mcfarland MRN: [id number] DOB: 1981-01-14 Today's Date: 11/13/2020    History of Present Illness 40 y/o M who presented from [facility] due to chest trauma, ground level fall causing Right PTX and Right 9th rib FX.  PMH GERD and gastric ulcers, reports a recent history of gastric ulcers.   Clinical Impression   Patient admitted for the diagnosis above.  Currently has a chest tube on wall suction.  PTA he was not employed, but was independent in all aspects of care and mobility.  Pain to his R chest is the primary barrier.  Currently he is moving slow, but needed only supervision for in room mobility without an AD, and up to Min A with ADL due to limited use of his R arm due to pain.  Acute OT will follow in the acute setting, but it anticipated he will progress quickly and not need any post acute OT.      Follow Up Recommendations  No OT follow up    Equipment Recommendations  None recommended by OT    Recommendations for Other Services       Precautions / Restrictions Precautions Precautions: Other (comment) Precaution Comments: Chest tube Restrictions Weight Bearing Restrictions: No      Mobility Bed Mobility Overal bed mobility: Modified Independent                  Transfers Overall transfer level: Needs assistance   Transfers: Sit to/from Stand;Stand Pivot Transfers Sit to Stand: Supervision Stand pivot transfers: Supervision            Balance Overall balance assessment: Mild deficits observed, not formally tested                                         ADL either performed or assessed with clinical judgement   ADL Overall ADL's : Needs assistance/impaired     Grooming: Wash/dry hands;Supervision/safety;Standing           Upper Body Dressing : Minimal assistance;Standing   Lower Body Dressing: Min guard;Sit to/from stand   Toilet Transfer:  Supervision/safety;Ambulation   Toileting- Clothing Manipulation and Hygiene: Independent;Sit to/from stand       Functional mobility during ADLs: Supervision/safety       Vision Patient Visual Report: No change from baseline       Perception     Praxis      Pertinent Vitals/Pain Pain Assessment: 0-10 Pain Score: 6  Pain Location: R chest Pain Descriptors / Indicators: Grimacing;Guarding;Sharp;Shooting;Spasm Pain Intervention(s): Monitored during session     Hand Dominance Right   Extremity/Trunk Assessment Upper Extremity Assessment Upper Extremity Assessment: Overall WFL for tasks assessed;RUE deficits/detail RUE: Unable to fully assess due to pain RUE Sensation: WNL RUE Coordination: WNL   Lower Extremity Assessment Lower Extremity Assessment: Defer to PT evaluation   Cervical / Trunk Assessment Cervical / Trunk Assessment: Normal   Communication Communication Communication: No difficulties   Cognition Arousal/Alertness: Awake/alert Behavior During Therapy: WFL for tasks assessed/performed Overall Cognitive Status: Within Functional Limits for tasks assessed                                                      Home Living  Family/patient expects to be discharged to:: Private residence Living Arrangements: Spouse/significant other Available Help at Discharge: Family;Available 24 hours/day Type of Home: House Home Access: Stairs to enter [employer] of Steps: 2 Entrance Stairs-Rails: None Home Layout: One level     Bathroom Shower/Tub: Chief Strategy Officer: Standard     Home Equipment: None          Prior Functioning/Environment Level of Independence: Independent                 OT Problem List: Impaired balance (sitting and/or standing);Pain      OT Treatment/Interventions: Self-care/ADL training;Balance training;Therapeutic activities    OT Goals(Current goals can be found in the care  plan section) Acute Rehab OT Goals Patient Stated Goal: Hoping to feel better and move better OT Goal Formulation: With patient Time For Goal Achievement: 11/27/20 Potential to Achieve Goals: Good  OT Frequency: Min 2X/week   Barriers to D/C:    none noted       Co-evaluation              AM-PAC OT "6 Clicks" Daily Activity     Outcome Measure Help from another person eating meals?: None Help from another person taking care of personal grooming?: None Help from another person toileting, which includes using toliet, bedpan, or urinal?: A Little Help from another person bathing (including washing, rinsing, drying)?: A Little Help from another person to put on and taking off regular upper body clothing?: A Little Help from another person to put on and taking off regular lower body clothing?: A Little 6 Click Score: 20   End of Session Nurse Communication: Mobility status  Activity Tolerance: Patient limited by pain Patient left: in bed;with call bell/phone within reach  OT Visit Diagnosis: Unsteadiness on feet (R26.81);Pain Pain - Right/Left: Right                Time: 0277-4128 OT Time Calculation (min): 17 min Charges:  OT General Charges $OT Visit: 1 Visit OT Evaluation $OT Eval Moderate Complexity: 1 Mod  11/13/2020  Rich, OTR/L  Acute Rehabilitation Services  Office:  [phone]   Suzanna Obey 11/13/2020, 4:53 PM

## 2020-11-13 NOTE — ED Notes (Signed)
Chest tube placed, Dr. Don Perking suturing in place at this time.

## 2020-11-13 NOTE — Plan of Care (Signed)
  Problem: Education: Goal: Knowledge of General Education information will improve Description Including pain rating scale, medication(s)/side effects and non-pharmacologic comfort measures Outcome: Progressing   

## 2020-11-13 NOTE — ED Notes (Signed)
EMTALA reviewed by this RN and transfer consent hard copy signed.

## 2020-11-14 ENCOUNTER — Inpatient Hospital Stay (HOSPITAL_COMMUNITY): Payer: Self-pay

## 2020-11-14 LAB — URINALYSIS, ROUTINE W REFLEX MICROSCOPIC
Glucose, UA: NEGATIVE mg/dL
Hgb urine dipstick: NEGATIVE
Ketones, ur: NEGATIVE mg/dL
Leukocytes,Ua: NEGATIVE
Nitrite: NEGATIVE
Protein, ur: NEGATIVE mg/dL
Specific Gravity, Urine: 1.038 — ABNORMAL HIGH (ref 1.005–1.030)
pH: 5 (ref 5.0–8.0)

## 2020-11-14 LAB — CBC
HCT: 43 % (ref 39.0–52.0)
Hemoglobin: 14.5 g/dL (ref 13.0–17.0)
MCH: 28.3 pg (ref 26.0–34.0)
MCHC: 33.7 g/dL (ref 30.0–36.0)
MCV: 84 fL (ref 80.0–100.0)
Platelets: 120 10*3/uL — ABNORMAL LOW (ref 150–400)
RBC: 5.12 MIL/uL (ref 4.22–5.81)
RDW: 13.5 % (ref 11.5–15.5)
WBC: 7 10*3/uL (ref 4.0–10.5)
nRBC: 0 % (ref 0.0–0.2)

## 2020-11-14 MED ORDER — KETOROLAC TROMETHAMINE 15 MG/ML IJ SOLN
15.0000 mg | Freq: Four times a day (QID) | INTRAMUSCULAR | Status: AC
  Administered 2020-11-14 – 2020-11-15 (×4): 15 mg via INTRAVENOUS
  Filled 2020-11-14 (×4): qty 1

## 2020-11-14 MED ORDER — HYDROMORPHONE HCL 1 MG/ML IJ SOLN
1.0000 mg | INTRAMUSCULAR | Status: DC | PRN
  Filled 2020-11-14: qty 1

## 2020-11-14 NOTE — Plan of Care (Signed)
  Problem: Education: Goal: Knowledge of General Education information will improve Description: Including pain rating scale, medication(s)/side effects and non-pharmacologic comfort measures Outcome: Progressing   Problem: Health Behavior/Discharge Planning: Goal: Ability to manage health-related needs will improve Outcome: Progressing   Problem: Clinical Measurements: Goal: Ability to maintain clinical measurements within normal limits will improve Outcome: Progressing Goal: Will remain free from infection Outcome: Progressing Goal: Diagnostic test results will improve Outcome: Progressing Goal: Respiratory complications will improve Outcome: Progressing   Problem: Activity: Goal: Risk for activity intolerance will decrease Outcome: Progressing   Problem: Nutrition: Goal: Adequate nutrition will be maintained Outcome: Progressing   Problem: Elimination: Goal: Will not experience complications related to urinary retention Outcome: Progressing   Problem: Pain Managment: Goal: General experience of comfort will improve Outcome: Progressing   Problem: Safety: Goal: Ability to remain free from injury will improve Outcome: Progressing   Problem: Skin Integrity: Goal: Risk for impaired skin integrity will decrease Outcome: Progressing

## 2020-11-14 NOTE — Progress Notes (Signed)
Patient ID: Eric Mcfarland, male   DOB: 1980-12-12, 40 y.o.   MRN: [id number] [facility] Surgery Progress Note:   * No surgery found *  Subjective: Mental status is clear.  Complaints discussed mechanism of injury when running in the rain. Objective: Vital signs in last 24 hours: Temp:  [97.5 F (36.4 C)-98.4 F (36.9 C)] 98 F (36.7 C) (03/26 0647) Pulse Rate:  [61-66] 64 (03/26 0647) Resp:  [17-19] 18 (03/26 0647) BP: (116-143)/(62-94) 136/78 (03/26 0647) SpO2:  [95 %-99 %] 99 % (03/26 0647)  Intake/Output from previous day: 03/25 0701 - 03/26 0700 In: 237 [P.O.:237] Out: 608 [Urine:600; Chest Tube:8] Intake/Output this shift: Total I/O In: -  Out: 325 [Urine:325]  Physical Exam: Work of breathing is not labored.  Right 22 Fr chest tube -no leak noted.    Lab Results:  Results for orders placed or performed during the hospital encounter of 11/13/20 (from the past 48 hour(s))  Basic metabolic panel     Status: Abnormal   Collection Time: 11/13/20  6:25 AM  Result Value Ref Range   Sodium 135 135 - 145 mmol/L   Potassium 4.0 3.5 - 5.1 mmol/L   Chloride 102 98 - 111 mmol/L   CO2 23 22 - 32 mmol/L   Glucose, Bld 117 (H) 70 - 99 mg/dL    Comment: Glucose reference range applies only to samples taken after fasting for at least 8 hours.   BUN 17 6 - 20 mg/dL   Creatinine, Ser 4.19 0.61 - 1.24 mg/dL   Calcium 8.8 (L) 8.9 - 10.3 mg/dL   GFR, Estimated >37 >90 mL/min    Comment: (NOTE) Calculated using the CKD-EPI Creatinine Equation (2021)    Anion gap 10 5 - 15    Comment: Performed at [facility], [address], [place], [place] [address]  CBC with Differential     Status: Abnormal   Collection Time: 11/13/20  6:25 AM  Result Value Ref Range   WBC 11.2 (H) 4.0 - 10.5 K/uL   RBC 5.49 4.22 - 5.81 MIL/uL   Hemoglobin 15.5 13.0 - 17.0 g/dL   HCT 35.3 29.9 - 24.2 %   MCV 85.1 80.0 - 100.0 fL   MCH 28.2 26.0 - 34.0 pg   MCHC 33.2 30.0 - 36.0 g/dL   RDW  68.3 41.9 - 62.2 %   Platelets 175 150 - 400 K/uL   nRBC 0.0 0.0 - 0.2 %   Neutrophils Relative % 73 %   Neutro Abs 8.1 (H) 1.7 - 7.7 K/uL   Lymphocytes Relative 17 %   Lymphs Abs 1.9 0.7 - 4.0 K/uL   Monocytes Relative 10 %   Monocytes Absolute 1.1 (H) 0.1 - 1.0 K/uL   Eosinophils Relative 0 %   Eosinophils Absolute 0.0 0.0 - 0.5 K/uL   Basophils Relative 0 %   Basophils Absolute 0.0 0.0 - 0.1 K/uL   Immature Granulocytes 0 %   Abs Immature Granulocytes 0.03 0.00 - 0.07 K/uL    Comment: Performed at [facility], [address] [place], [place] [address]  Protime-INR     Status: None   Collection Time: 11/13/20  6:25 AM  Result Value Ref Range   Prothrombin Time 13.4 11.4 - 15.2 seconds   INR 1.1 0.8 - 1.2    Comment: (NOTE) INR goal varies based on device and disease states. Performed at [facility], [address] [place], [place] [address]  HIV Antibody (routine testing w rflx)     Status: None   Collection Time: 11/13/20  9:14 AM  Result Value Ref Range   HIV Screen 4th Generation wRfx Non Reactive Non Reactive    Comment: Performed at [facility], [address] [place], [place] [address]  CBC     Status: Abnormal   Collection Time: 11/14/20  2:49 AM  Result Value Ref Range   WBC 7.0 4.0 - 10.5 K/uL   RBC 5.12 4.22 - 5.81 MIL/uL   Hemoglobin 14.5 13.0 - 17.0 g/dL   HCT 69.6 29.5 - 28.4 %   MCV 84.0 80.0 - 100.0 fL   MCH 28.3 26.0 - 34.0 pg   MCHC 33.7 30.0 - 36.0 g/dL   RDW 13.2 44.0 - 10.2 %   Platelets 120 (L) 150 - 400 K/uL    Comment: SPECIMEN CHECKED FOR CLOTS REPEATED TO VERIFY    nRBC 0.0 0.0 - 0.2 %    Comment: Performed at [facility], [address] [place], [place] [address]    Radiology/Results: DG Ribs Unilateral W/Chest Right  Result Date: 11/13/2020 CLINICAL DATA:  Right lateral rib pain, pop after cough EXAM: RIGHT RIBS AND CHEST - 3+ VIEW COMPARISON:  Radiograph 11/07/2016 FINDINGS: Minimally displaced  fracture of the posterolateral right ninth rib. Moderate volume right pneumothorax occupying approximately 20-30% of the chest cavity. Medial opacities in the right lung are favored to reflect some atelectatic change. Left lung is clear without left pneumothorax or effusion. Mediastinal contours are unremarkable without significant mediastinal shift. No other acute osseous or soft tissue abnormality. IMPRESSION: Right ninth rib fracture. Moderate volume right pneumothorax occupying approximately 20-30% of the chest cavity. Trace blunting of the costophrenic sulcus could reflect some minimal pleural fluid/hemothorax. No significant mediastinal shift. Critical Value/emergent results were called by telephone at the time of interpretation on 11/13/2020 at 12:58 am to provider [facility] , who verbally acknowledged these results. Electronically Signed   By: Kreg Shropshire M.D.   On: 11/13/2020 00:56   CT Cervical Spine Wo Contrast  Result Date: 11/13/2020 CLINICAL DATA:  40 year old male with neck trauma. EXAM: CT CERVICAL SPINE WITHOUT CONTRAST TECHNIQUE: Multidetector CT imaging of the cervical spine was performed without intravenous contrast. Multiplanar CT image reconstructions were also generated. COMPARISON:  None. FINDINGS: Alignment: No acute subluxation. There is mild reversal of normal cervical lordosis which may be positional or due to muscle spasm. Skull base and vertebrae: No acute fracture. Soft tissues and spinal canal: No prevertebral fluid or swelling. No visible canal hematoma. Disc levels: No acute findings. No significant degenerative changes. Upper chest: Biapical subpleural blebs. Partially visualized small right pneumothorax. A chest tube is seen in the right apex. Other: None IMPRESSION: 1. No acute/traumatic cervical spine pathology. 2. Partially visualized small right apical pneumothorax and chest tube. Electronically Signed   By: Elgie Collard M.D.   On: 11/13/2020 03:26   DG Chest  Portable 1 View  Result Date: 11/13/2020 CLINICAL DATA:  Fall with chest tube EXAM: PORTABLE CHEST 1 VIEW COMPARISON:  11/13/2020 FINDINGS: Apically directed right chest tube. Suspected residual right apical pneumothorax is less well visualized on this exam. There is some persistent right basilar opacity likely reflecting combination of layering pleural effusion and adjacent passive atelectasis. Small amount of gas along the right chest wall. Redemonstration of the right ninth rib fracture. Additional atelectatic changes are noted in the left lung which is otherwise clear. Stable cardiomediastinal contours. No  other acute osseous or soft tissue abnormalities. Telemetry leads overlie the chest. IMPRESSION: 1. Apically directed right chest tube with without visible residual right apical pneumothorax. 2. Persistent and slightly increasing right basilar opacity likely reflecting combination of layering pleural effusion and adjacent passive atelectasis. 3. Redemonstrated right ninth rib fracture. Subcutaneous emphysema across the right chest wall. Electronically Signed   By: Kreg Shropshire M.D.   On: 11/13/2020 06:16   DG Chest Portable 1 View  Result Date: 11/13/2020 CLINICAL DATA:  Chest tube placement EXAM: PORTABLE CHEST 1 VIEW COMPARISON:  Radiograph 11/13/2020 FINDINGS: Interval placement of an apically directed right chest tube with only a trace residual apical pneumothorax. Partial re-expansion of the right lung with some residual bandlike opacities in the right lung base favoring subsegmental atelectasis given elevation of the right hemidiaphragm. Blunting of the costophrenic sulcus could reflect trace pleural fluid. Left lung is clear. Cardiomediastinal contours are unremarkable. No other acute osseous or soft tissue abnormality. Telemetry leads overlie the chest. IMPRESSION: 1. Interval placement of an apically directed right chest tube with only a trace residual apical pneumothorax and basilar pleural  fluid. 2. Partial re-expansion of the right lung with some residual bandlike opacities in the right lung base favoring subsegmental atelectasis. Electronically Signed   By: Kreg Shropshire M.D.   On: 11/13/2020 01:58    Anti-infectives: Anti-infectives (From admission, onward)   None      Assessment/Plan: Problem List: Patient Active Problem List   Diagnosis Date Noted  . Pneumothorax on right 11/13/2020    Pain in right chest-add Toradol;  Will get repeat chest xray today.   * No surgery found *    LOS: 1 day   Matt B. Daphine Deutscher, MD, [facility] [phone] to reach the surgeon on call.    11/14/2020 8:47 AM

## 2020-11-14 NOTE — Evaluation (Signed)
Physical Therapy Evaluation Patient Details Name: Eric Mcfarland MRN: [id number] DOB: 1980-09-21 Today's Date: 11/14/2020   History of Present Illness  40 y/o M who presented from [facility] due to chest trauma, ground level fall causing Right PTX and Right 9th rib FX.  PMH GERD and gastric ulcers, reports a recent history of gastric ulcers.  Clinical Impression  Patient received sitting up on side of bed. Significant other present. Patient reports he just got washed up, he is agreeable to PT assessment. Patient performed sit to stand transfer with mod independence. Increased time needed due to soreness. Patient ambulated 300 feet without ad and supervision to min guard for safety. Slow, labored cadence due to pain. Patient will continue to benefit from skilled PT while here to improve functional mobility and independence.     Follow Up Recommendations No PT follow up    Equipment Recommendations  None recommended by PT    Recommendations for Other Services       Precautions / Restrictions Precautions Precaution Comments: Chest tube Restrictions Weight Bearing Restrictions: No      Mobility  Bed Mobility Overal bed mobility: Modified Independent Bed Mobility: Sit to Supine       Sit to supine: Modified independent (Device/Increase time);HOB elevated   General bed mobility comments: Patient able to return to supine from sitting with mod independence. HOB elevated for comfort    Transfers Overall transfer level: Modified independent Equipment used: None Transfers: Sit to/from Stand Sit to Stand: Modified independent (Device/Increase time)         General transfer comment: No assistance needed  Ambulation/Gait Ambulation/Gait assistance: Min guard;Supervision Gait Distance (Feet): 300 Feet Assistive device: None Gait Pattern/deviations: Step-through pattern;Decreased step length - right;Decreased step length - left Gait velocity: decr   General Gait Details: patient  ambulates with slow cadence, stiff posture. Good balance  Stairs            Wheelchair Mobility    Modified Rankin (Stroke Patients Only)       Balance Overall balance assessment: Modified Independent                                           Pertinent Vitals/Pain Pain Assessment: Faces Faces Pain Scale: Hurts even more Pain Location: R chest Pain Descriptors / Indicators: Grimacing;Guarding;Sore;Discomfort Pain Intervention(s): Limited activity within patient's tolerance;Monitored during session;Premedicated before session;Repositioned    Home Living Family/patient expects to be discharged to:: Private residence Living Arrangements: Spouse/significant other Available Help at Discharge: Family;Available 24 hours/day Type of Home: House Home Access: Stairs to enter Entrance Stairs-Rails: None Entrance Stairs-Number of Steps: 2 Home Layout: One level Home Equipment: None      Prior Function Level of Independence: Independent               Hand Dominance   Dominant Hand: Right    Extremity/Trunk Assessment   Upper Extremity Assessment Upper Extremity Assessment: Defer to OT evaluation    Lower Extremity Assessment Lower Extremity Assessment: Overall WFL for tasks assessed    Cervical / Trunk Assessment Cervical / Trunk Assessment: Normal  Communication   Communication: No difficulties  Cognition Arousal/Alertness: Awake/alert Behavior During Therapy: WFL for tasks assessed/performed Overall Cognitive Status: Within Functional Limits for tasks assessed  General Comments      Exercises     Assessment/Plan    PT Assessment Patient needs continued PT services  PT Problem List Decreased mobility;Decreased activity tolerance;Pain       PT Treatment Interventions Gait training;Stair training;Functional mobility training;Therapeutic activities;Patient/family education     PT Goals (Current goals can be found in the Care Plan section)  Acute Rehab PT Goals Patient Stated Goal: Hoping to feel better and move better PT Goal Formulation: With patient Time For Goal Achievement: 11/19/20 Potential to Achieve Goals: Good    Frequency Min 3X/week   Barriers to discharge        Co-evaluation               AM-PAC PT "6 Clicks" Mobility  Outcome Measure Help needed turning from your back to your side while in a flat bed without using bedrails?: A Little Help needed moving from lying on your back to sitting on the side of a flat bed without using bedrails?: A Little Help needed moving to and from a bed to a chair (including a wheelchair)?: A Little Help needed standing up from a chair using your arms (e.g., wheelchair or bedside chair)?: A Little Help needed to walk in hospital room?: A Little Help needed climbing 3-5 steps with a railing? : A Little 6 Click Score: 18    End of Session   Activity Tolerance: Patient tolerated treatment well Patient left: in bed;with call bell/phone within reach;with family/visitor present Nurse Communication: Mobility status PT Visit Diagnosis: Difficulty in walking, not elsewhere classified (R26.2);Pain Pain - Right/Left: Right Pain - part of body:  (thrunk, site of chest tube)    Time: 2119-4174 PT Time Calculation (min) (ACUTE ONLY): 29 min   Charges:   PT Evaluation $PT Eval Moderate Complexity: 1 Mod PT Treatments $Gait Training: 8-22 mins        Rajanee Schuelke, PT, GCS 11/14/20,1:51 PM

## 2020-11-15 ENCOUNTER — Inpatient Hospital Stay (HOSPITAL_COMMUNITY): Payer: Self-pay

## 2020-11-15 NOTE — Progress Notes (Signed)
Per radiology, chest x-ray post right chest tube removal shows increased pneumothorax 30%. Trauma PA paged x2. No answer. Derrell Lolling, MD paged and returned call. Repeat chest x-ray scheduled for 1400 today. VSS. No new orders at this time. Will continue to monitor.

## 2020-11-15 NOTE — Plan of Care (Signed)
  Problem: Education: Goal: Knowledge of General Education information will improve Description: Including pain rating scale, medication(s)/side effects and non-pharmacologic comfort measures Outcome: Progressing   Problem: Health Behavior/Discharge Planning: Goal: Ability to manage health-related needs will improve Outcome: Progressing   Problem: Clinical Measurements: Goal: Ability to maintain clinical measurements within normal limits will improve Outcome: Progressing Goal: Will remain free from infection Outcome: Progressing Goal: Diagnostic test results will improve Outcome: Progressing Goal: Respiratory complications will improve Outcome: Progressing   Problem: Activity: Goal: Risk for activity intolerance will decrease Outcome: Progressing   Problem: Nutrition: Goal: Adequate nutrition will be maintained Outcome: Progressing   Problem: Elimination: Goal: Will not experience complications related to urinary retention Outcome: Progressing   Problem: Pain Managment: Goal: General experience of comfort will improve Outcome: Progressing   Problem: Safety: Goal: Ability to remain free from injury will improve Outcome: Progressing   Problem: Skin Integrity: Goal: Risk for impaired skin integrity will decrease Outcome: Progressing   

## 2020-11-15 NOTE — Progress Notes (Signed)
OT Cancellation Note  Patient Details Name: DELRAY REZA MRN: [id number] DOB: 04/17/1981   Cancelled Treatment:     Pt nurse asked therapy to hold. He needs his chest tube replaced.  Melissia Lahman 11/15/2020, 11:06 AM

## 2020-11-15 NOTE — Progress Notes (Signed)
Patient ID: Eric Mcfarland, male   DOB: 09/08/1980, 40 y.o.   MRN: [id number]  [facility] Surgery Progress Note:   * No surgery found *  Subjective: Mental status is clear.  Complaints right chest tube discomfort. Objective: Vital signs in last 24 hours: Temp:  [97.8 F (36.6 C)-98.5 F (36.9 C)] 98.2 F (36.8 C) (03/27 0456) Pulse Rate:  [63-78] 65 (03/27 0456) Resp:  [16-18] 16 (03/27 0456) BP: (118-140)/(59-73) 120/66 (03/27 0456) SpO2:  [97 %-100 %] 100 % (03/27 0456)  Intake/Output from previous day: 03/26 0701 - 03/27 0700 In: 1323 [P.O.:1320; I.V.:3] Out: 646 [Urine:625; Chest Tube:21] Intake/Output this shift: No intake/output data recorded.  Physical Exam: Work of breathing is not labored.  Right chest tube removed with suction on without apparent problem.  Thoracoscopy site is not tunnelled and vaseline gauze applied.    Lab Results:  Results for orders placed or performed during the hospital encounter of 11/13/20 (from the past 48 hour(s))  HIV Antibody (routine testing w rflx)     Status: None   Collection Time: 11/13/20  9:14 AM  Result Value Ref Range   HIV Screen 4th Generation wRfx Non Reactive Non Reactive    Comment: Performed at [facility], [address] [place], [place] [address]  CBC     Status: Abnormal   Collection Time: 11/14/20  2:49 AM  Result Value Ref Range   WBC 7.0 4.0 - 10.5 K/uL   RBC 5.12 4.22 - 5.81 MIL/uL   Hemoglobin 14.5 13.0 - 17.0 g/dL   HCT 19.1 47.8 - 29.5 %   MCV 84.0 80.0 - 100.0 fL   MCH 28.3 26.0 - 34.0 pg   MCHC 33.7 30.0 - 36.0 g/dL   RDW 62.1 30.8 - 65.7 %   Platelets 120 (L) 150 - 400 K/uL    Comment: SPECIMEN CHECKED FOR CLOTS REPEATED TO VERIFY    nRBC 0.0 0.0 - 0.2 %    Comment: Performed at [facility], [address], [place], [place] [address]  Urinalysis, Routine w reflex microscopic Urine, Clean Catch     Status: Abnormal   Collection Time: 11/14/20  8:23 AM  Result Value Ref Range    Color, Urine AMBER (A) YELLOW    Comment: BIOCHEMICALS MAY BE AFFECTED BY COLOR   APPearance CLEAR CLEAR   Specific Gravity, Urine 1.038 (H) 1.005 - 1.030   pH 5.0 5.0 - 8.0   Glucose, UA NEGATIVE NEGATIVE mg/dL   Hgb urine dipstick NEGATIVE NEGATIVE   Bilirubin Urine MODERATE (A) NEGATIVE   Ketones, ur NEGATIVE NEGATIVE mg/dL   Protein, ur NEGATIVE NEGATIVE mg/dL   Nitrite NEGATIVE NEGATIVE   Leukocytes,Ua NEGATIVE NEGATIVE    Comment: Performed at [facility], [address], [place], [place] [address]    Radiology/Results: DG Chest 2 View  Result Date: 11/14/2020 CLINICAL DATA:  Status post right chest tube placement for spontaneous pneumothorax. EXAM: CHEST - 2 VIEW COMPARISON:  Film earlier today at 0700 hours FINDINGS: Stable heart size. Stable positioning of right chest tube with no visualized pneumothorax. Stable atelectasis at both lung bases. No pleural effusions or pulmonary edema. IMPRESSION: No pneumothorax. Stable atelectasis at both lung bases. Electronically Signed   By: Irish Lack M.D.   On: 11/14/2020 12:10   DG Chest Port 1 View  Result Date: 11/14/2020 CLINICAL DATA:  Follow-up RIGHT pneumothorax. EXAM: PORTABLE CHEST 1 VIEW COMPARISON:  11/13/2020 and prior studies FINDINGS: A  RIGHT thoracostomy tube is again identified, without pneumothorax. Bibasilar atelectasis again noted. The cardiomediastinal silhouette is unremarkable. No pleural effusion noted. RIGHT 9th rib fracture is faintly visualized. IMPRESSION: RIGHT thoracostomy tube without pneumothorax. Bibasilar atelectasis again noted. Electronically Signed   By: Harmon Pier M.D.   On: 11/14/2020 09:15    Anti-infectives: Anti-infectives (From admission, onward)   None      Assessment/Plan: Problem List: Patient Active Problem List   Diagnosis Date Noted  . Pneumothorax on right 11/13/2020    Followup xray showed that there is a right pneumothorax present now.  There was no air leak before.   Will repeat CXR in 6 hours to see if repeat chest tube may be indicated.   * No surgery found *    LOS: 2 days   Matt B. Daphine Deutscher, MD, [facility] [phone] to reach the surgeon on call.    11/15/2020 8:49 AM

## 2020-11-16 ENCOUNTER — Other Ambulatory Visit: Payer: Self-pay | Admitting: General Surgery

## 2020-11-16 ENCOUNTER — Inpatient Hospital Stay (HOSPITAL_COMMUNITY): Payer: Self-pay

## 2020-11-16 MED ORDER — IBUPROFEN 200 MG PO TABS: 600.0000 mg | ORAL_TABLET | Freq: Three times a day (TID) | ORAL | Status: DC | PRN

## 2020-11-16 MED ORDER — ACETAMINOPHEN 500 MG PO TABS: 1000.0000 mg | ORAL_TABLET | Freq: Four times a day (QID) | ORAL | 0 refills | Status: DC

## 2020-11-16 MED ORDER — METHOCARBAMOL 750 MG PO TABS: 750.0000 mg | ORAL_TABLET | Freq: Three times a day (TID) | ORAL | 0 refills | Status: DC | PRN

## 2020-11-16 MED ORDER — OXYCODONE HCL 5 MG PO TABS
5.0000 mg | ORAL_TABLET | Freq: Four times a day (QID) | ORAL | 0 refills | Status: DC | PRN
Start: 2020-11-16 — End: 2024-05-03

## 2020-11-16 MED FILL — oxyCODONE HCL 5 MG TABS: 5 | 3 days supply | Qty: 15 | Fill #0

## 2020-11-16 MED FILL — METHOCARBAMOL 750 MG TABS: 750 | 10 days supply | Qty: 30 | Fill #0

## 2020-11-16 NOTE — TOC Transition Note (Signed)
Transition of Care [facility]) - CM/SW Discharge Note   Patient Details  Name: Eric Mcfarland MRN: [id number] Date of Birth: [birth year]  Transition of Care [facility]) CM/SW Contact:  Glennon Mac, RN Phone Number: 11/16/2020, 11:08 AM   Clinical Narrative:   40 y/o M who presented from [facility] due to chest trauma, ground level fall causing Right PTX and Right 9th rib FX. PTA, pt independent and living in [place] with significant other.  PT/OT recommending no OP follow up or DME.  Pt is uninsured, but is eligible for medication assistance through [facility] program.  DC Rx sent to [facility] pharmacy to be filled using [facility] letter with co-pay overrides.  Pt requests transportation to home; waiver signed, and nurse aware to call [phone] when pt ready for dc.      Final next level of care: Home/Self Care Barriers to Discharge: Barriers Resolved   Patient Goals and CMS Choice Patient states their goals for this hospitalization and ongoing recovery are:: to go home                            Discharge Plan and Services   Discharge Planning Services: CM Consult,Medication [facility] Program                                      Readmission Risk Interventions Readmission Risk Prevention Plan 11/16/2020  Post Dischage Appt Complete  Medication Screening Complete  Transportation Screening Complete  Some recent data might be hidden   Quintella Baton, RN, BSN  Trauma/Neuro ICU Case Manager [phone]
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
## 2020-11-16 NOTE — Discharge Instructions (Signed)
Pneumothorax A pneumothorax is commonly called a collapsed lung. It is a condition in which air leaks from a lung and builds up between the thin layer of tissue that covers the lungs (visceral pleura) and the interior wall of the chest cavity (parietal pleura). The air gets trapped outside the lung, between the lung and the chest wall (pleural space). The air takes up space and prevents the lung from fully expanding. This condition sometimes occurs suddenly with no apparent cause. The buildup of air may be small or large. A small pneumothorax may go away on its own. A large pneumothorax will require treatment and hospitalization. What are the causes? This condition may be caused by:  Trauma and injury to the chest wall.  Surgery and other medical procedures.  A complication of an underlying lung problem, especially chronic obstructive pulmonary disease (COPD) or emphysema. Sometimes the cause of this condition is not known. What increases the risk? You are more likely to develop this condition if:  You have an underlying lung problem.  You smoke.  You are 40-40 years old, male, tall, and underweight.  You have a personal or family history of pneumothorax.  You have an eating disorder (anorexia nervosa). This condition can also happen quickly, even in people with no history of lung problems. What are the signs or symptoms? Sometimes a pneumothorax will have no symptoms. When symptoms are present, they can include:  Chest pain.  Shortness of breath.  Increased rate of breathing.  Bluish color to your lips or skin (cyanosis). How is this diagnosed? This condition may be diagnosed by:  A medical history and physical exam.  A chest X-ray, chest CT scan, or ultrasound. How is this treated? Treatment depends on how severe your condition is. The goal of treatment is to remove the extra air and allow your lung to expand back to its normal size.  For a small pneumothorax: ? No  treatment may be needed. ? Extra oxygen is sometimes used to make it go away more quickly.  For a large pneumothorax or one that is causing symptoms, a procedure is done to release the air from around your lungs. To do this, a health care provider may use: ? A needle with a syringe. This is used to suck air from a pleural space where no additional leakage is taking place. ? A chest tube. This is used to suck air where there is ongoing leakage into the pleural space. The chest tube may need to remain in place for several days until the air leak has healed.  In more severe cases, surgery may be needed to repair the damage that is causing the leak.  If you have multiple pneumothorax episodes or have an air leak that will not heal, a procedure called a pleurodesis may be done. A medicine is placed in the pleural space to irritate the tissues around the lung so that the lung will stick to the chest wall, seal any leaks, and stop any buildup of air in that space. If you have an underlying lung problem, severe symptoms, or a large pneumothorax you will usually need to stay in the hospital.   Follow these instructions at home: Lifestyle  Do not use any products that contain nicotine or tobacco, such as cigarettes and e-cigarettes. These are major risk factors in pneumothorax. If you need help quitting, ask your health care provider.  Do not lift anything that is heavier than 10 lb (4.5 kg), or the limit that your  health care provider tells you, until he or she says that it is safe.  Avoid activities that take a lot of effort (strenuous) for as long as told by your health care provider.  Return to your normal activities as told by your health care provider. Ask your health care provider what activities are safe for you.  Do not fly in an airplane or scuba dive until your health care provider says it is okay. General instructions  Take over-the-counter and prescription medicines only as told by your  health care provider.  If a cough or pain makes it difficult for you to sleep at night, try sleeping in a semi-upright position in a recliner or by using 2 or 3 pillows.  If you had a chest tube and it was removed, ask your health care provider when you can remove the bandage (dressing). While the dressing is in place, do not allow it to get wet.  Keep all follow-up visits as told by your health care provider. This is important. Contact a health care provider if:  You cough up thick mucus (sputum) that is yellow or green in color.  You were treated with a chest tube, and you have redness, increasing pain, or discharge at the site where it was placed. Get help right away if:  You have increasing chest pain or shortness of breath.  You have a cough that will not go away.  You begin coughing up blood.  You have pain that is getting worse or is not controlled with medicines.  The site where your chest tube was located opens up.  You feel air coming out of the site where the chest tube was placed.  You have a fever or persistent symptoms for more than 2-3 days. These symptoms may represent a serious problem that is an emergency. Do not wait to see if the symptoms will go away. Get medical help right away. Call your local emergency services (911 in the [place] Do not drive yourself to the hospital. Summary  A pneumothorax, commonly called a collapsed lung, is a condition in which air leaks from a lung and gets trapped between the lung and the chest wall (pleural space).  The buildup of air may be small or large. A small pneumothorax may go away on its own. A large pneumothorax will require treatment and hospitalization.  Treatment for this condition depends on how severe the pneumothorax is. The goal of treatment is to remove the extra air and allow the lung to expand back to its normal size. This information is not intended to replace advice given to you by your health care provider.  Make sure you discuss any questions you have with your health care provider. Document Revised: 04/09/2020 Document Reviewed: 04/09/2020 Elsevier Patient Education  2021 Elsevier Inc.   Rib Fracture  A rib fracture is a break or crack in one of the bones of the ribs. The ribs are like a cage that goes around your upper chest. A broken or cracked rib is often painful, but most do not cause other problems. Most rib fractures usually heal on their own in 1-3 months. What are the causes?  Doing movements over and over again with a lot of force, such as pitching a baseball or having a very bad cough.  A direct hit to the chest.  Cancer that has spread to the bones. What are the signs or symptoms?  Pain when you breathe in or cough.  Pain when someone presses  on the injured area.  Feeling short of breath. How is this treated? Treatment depends on how bad the fracture is. In general:  Most rib fractures usually heal on their own in 1-3 months.  Healing may take longer if you have a cough or are doing activities that make the injury worse.  While you heal, you may be given medicines to control pain.  You will also be taught deep breathing exercises.  Very bad injuries may require a stay at the hospital or surgery. Follow these instructions at home: Managing pain, stiffness, and swelling  If told, put ice on the injured area. To do this: ? Put ice in a plastic bag. ? Place a towel between your skin and the bag. ? Leave the ice on for 20 minutes, 2-3 times a day. ? Take off the ice if your skin turns bright red. This is very important. If you cannot feel pain, heat, or cold, you have a greater risk of damage to the area.  Take over-the-counter and prescription medicines only as told by your doctor. Activity  Avoid activities that cause pain to the injured area. Protect your injured area.  Slowly increase activity as told by your doctor. General instructions  Do deep breathing  exercises as told by your doctor. You may be told to: ? Take deep breaths many times a day. ? Cough several times a day while hugging a pillow. ? Use a device (incentive spirometer) to do deep breathing many times a day.  Drink enough fluid to keep your pee (urine) clear or pale yellow.  Do not wear a rib belt or binder.  Keep all follow-up visits. Contact a doctor if:  You have a fever. Get help right away if:  You have trouble breathing.  You are short of breath.  You cannot stop coughing.  You cough up thick or bloody spit.  You feel like you may vomit (nauseous), vomit, or have belly (abdominal) pain.  Your pain gets worse and medicine does not help. These symptoms may be an emergency. Get help right away. Call your local emergency services (911 in the [place]  Do not wait to see if the symptoms will go away.  Do not drive yourself to the hospital. Summary  A rib fracture is a break or crack in one of the bones of the ribs.  Apply ice to the injured area and take medicines for pain as told by your doctor.  Take deep breaths and cough several times a day. Hug a pillow every time you cough. This information is not intended to replace advice given to you by your health care provider. Make sure you discuss any questions you have with your health care provider. Document Revised: 11/29/2019 Document Reviewed: 11/29/2019 Elsevier Patient Education  2021 Elsevier Inc.  
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
## 2021-06-11 ENCOUNTER — Other Ambulatory Visit: Payer: Self-pay

## 2021-06-11 ENCOUNTER — Emergency Department: Payer: Self-pay

## 2021-06-11 ENCOUNTER — Emergency Department
Admission: EM | Admit: 2021-06-11 | Discharge: 2021-06-11 | Disposition: A | Discharge status: Home / Self Care | Payer: Self-pay | Attending: Emergency Medicine | Admitting: Emergency Medicine

## 2021-06-11 DIAGNOSIS — S82401A Unspecified fracture of shaft of right fibula, initial encounter for closed fracture: Secondary | ICD-10-CM

## 2021-06-11 DIAGNOSIS — X58XXXA Exposure to other specified factors, initial encounter: Secondary | ICD-10-CM | POA: Insufficient documentation

## 2021-06-11 DIAGNOSIS — M25572 Pain in left ankle and joints of left foot: Secondary | ICD-10-CM | POA: Insufficient documentation

## 2021-06-11 DIAGNOSIS — F17210 Nicotine dependence, cigarettes, uncomplicated: Secondary | ICD-10-CM | POA: Insufficient documentation

## 2021-06-11 DIAGNOSIS — S82402A Unspecified fracture of shaft of left fibula, initial encounter for closed fracture: Secondary | ICD-10-CM | POA: Insufficient documentation

## 2021-06-11 DIAGNOSIS — Y9372 Activity, wrestling: Secondary | ICD-10-CM | POA: Insufficient documentation

## 2021-06-11 DIAGNOSIS — Y92007 Garden or yard of unspecified non-institutional (private) residence as the place of occurrence of the external cause: Secondary | ICD-10-CM | POA: Insufficient documentation

## 2021-06-11 MED ORDER — IBUPROFEN 800 MG PO TABS: 800.0000 mg | ORAL_TABLET | Freq: Three times a day (TID) | ORAL | 0 refills | Status: DC | PRN

## 2021-06-11 MED ORDER — OXYCODONE-ACETAMINOPHEN 7.5-325 MG PO TABS: 1.0000 | ORAL_TABLET | Freq: Four times a day (QID) | ORAL | 0 refills | Status: AC | PRN

## 2021-06-11 MED ORDER — OXYCODONE HCL 5 MG PO TABS
5.0000 mg | ORAL_TABLET | Freq: Once | ORAL | Status: AC
  Administered 2021-06-11: 5 mg via ORAL
  Filled 2021-06-11: qty 1

## 2021-06-11 MED ORDER — HYDROMORPHONE HCL 1 MG/ML IJ SOLN
1.0000 mg | Freq: Once | INTRAMUSCULAR | Status: AC
  Administered 2021-06-11: 1 mg via INTRAMUSCULAR
  Filled 2021-06-11: qty 1

## 2021-06-11 NOTE — ED Provider Notes (Signed)
[facility] Emergency Department Provider Note   ____________________________________________   Event Date/Time   First MD Initiated Contact with Patient 06/11/21 1349     (approximate)  I have reviewed the triage vital signs and the nursing notes.   HISTORY  Chief Complaint Ankle Pain    HPI Eric Mcfarland is a 40 y.o. male patient complain of left ankle pain secondary to stepping in a hole while wrestling with his brother prior to arrival.  Patient unable to bear weight secondary to pain.  Denies loss sensation.  Decreased range of motion is all fields noted by complaint of pain.  Rates pain as a 10/10.  Described pain as "achy".  No palliative measures prior to arrival.         Past Medical History:  Diagnosis Date   GERD (gastroesophageal reflux disease)    Reflux     Patient Active Problem List   Diagnosis Date Noted   Pneumothorax on right 11/13/2020    No past surgical history on file.  Prior to Admission medications   Medication Sig Start Date End Date Taking? Authorizing Provider  ibuprofen (ADVIL) 800 MG tablet Take 1 tablet (800 mg total) by mouth every 8 (eight) hours as needed for moderate pain. 06/11/21  Yes Joni Reining, PA-C  oxyCODONE-acetaminophen (PERCOCET) 7.5-325 MG tablet Take 1 tablet by mouth every 6 (six) hours as needed for up to 5 days. 06/11/21 06/16/21 Yes Joni Reining, PA-C  acetaminophen (TYLENOL) 500 MG tablet Take 2 tablets (1,000 mg total) by mouth every 6 (six) hours. 11/16/20   Barnetta Chapel, PA-C  Alum & Mag Hydroxide-Simeth (GI COCKTAIL) SUSP suspension Take 30 mLs by mouth 2 (two) times daily as needed (epigastric pain). Shake well. 09/27/20   Cuthriell, Delorise Royals, PA-C  methocarbamol (ROBAXIN) 750 MG tablet Take 1 tablet (750 mg total) by mouth every 8 (eight) hours as needed for muscle spasms. 11/16/20   Barnetta Chapel, PA-C  methocarbamol (ROBAXIN) 750 MG tablet TAKE 1 TABLET (750 MG TOTAL) BY  MOUTH EVERY EIGHT HOURS AS NEEDED FOR MUSCLE SPASMS. 11/16/20 11/16/21  Barnetta Chapel, PA-C  omeprazole (PRILOSEC) 10 MG capsule Take 1 capsule (10 mg total) by mouth daily. 09/27/20   Cuthriell, Delorise Royals, PA-C  oxyCODONE (OXY IR/ROXICODONE) 5 MG immediate release tablet Take 1 tablet (5 mg total) by mouth every 6 (six) hours as needed for moderate pain. 11/16/20   Barnetta Chapel, PA-C  promethazine (PHENERGAN) 25 MG tablet Take 1 tablet (25 mg total) by mouth every 6 (six) hours as needed for nausea or vomiting. 09/27/20   Cuthriell, Delorise Royals, PA-C    Allergies Penicillins  No family history on file.  Social History Social History   Tobacco Use   Smoking status: Every Day    Packs/day: 1.00    Types: Cigarettes   Smokeless tobacco: Never  Vaping Use   Vaping Use: Never used  Substance Use Topics   Alcohol use: Yes   Drug use: Yes    Types: Marijuana    Comment: last smoked 4-5 days ago    Review of Systems  Constitutional: No fever/chills Eyes: No visual changes. ENT: No sore throat. Cardiovascular: Denies chest pain. Respiratory: Denies shortness of breath. Gastrointestinal: GERD.  No abdominal pain.  No nausea, no vomiting.  No diarrhea.  No constipation. Genitourinary: Negative for dysuria. Musculoskeletal: Left ankle pain.   Skin: Negative for rash. Neurological: Negative for headaches, focal weakness or numbness. ____________________________________________   PHYSICAL  EXAM:  VITAL SIGNS: ED Triage Vitals  Enc Vitals Group     BP 06/11/21 1342 121/83     Pulse Rate 06/11/21 1342 (!) 112     Resp 06/11/21 1342 16     Temp 06/11/21 1342 98.9 F (37.2 C)     Temp src --      SpO2 06/11/21 1342 100 %     Weight 06/11/21 1343 130 lb (59 kg)     Height 06/11/21 1343 5\' 6"  (1.676 m)     Head Circumference --      Peak Flow --      Pain Score 06/11/21 1342 10     Pain Loc --      Pain Edu? --      Excl. in [place]? --     Constitutional: Alert and oriented.   Moderate distress. Cardiovascular: Normal rate, regular rhythm. Grossly normal heart sounds.  Good peripheral circulation.  Tachycardic. Respiratory: Normal respiratory effort.  No retractions. Lungs CTAB. Musculoskeletal: Edema deformity to the left ankle.   Neurologic:  Normal speech and language. No gross focal neurologic deficits are appreciated. No gait instability. Skin:  Skin is warm, dry and intact. No rash noted. Psychiatric: Mood and affect are normal. Speech and behavior are normal.  ____________________________________________   LABS (all labs ordered are listed, but only abnormal results are displayed)  Labs Reviewed - No data to display ____________________________________________  EKG   ____________________________________________  RADIOLOGY I, 06/13/21, personally viewed and evaluated these images (plain radiographs) as part of my medical decision making, as well as reviewing the written report by the radiologist.  ED MD interpretation: Mildly displaced distal fibular fracture  Official radiology report(s): DG Ankle Complete Left  Result Date: 06/11/2021 CLINICAL DATA:  Pain and edema.  Injury. EXAM: LEFT ANKLE COMPLETE - 3+ VIEW COMPARISON:  None. FINDINGS: Mildly displaced fracture distal fibula. No fracture of the tibia. Ankle mortise intact. Lateral soft tissue swelling. IMPRESSION: Mildly displaced fracture distal fibula Electronically Signed   By: 06/13/2021 M.D.   On: 06/11/2021 14:51    ____________________________________________   PROCEDURES  Procedure(s) performed (including Critical Care):  Procedures   ____________________________________________   INITIAL IMPRESSION / ASSESSMENT AND PLAN / ED COURSE  As part of my medical decision making, I reviewed the following data within the electronic MEDICAL RECORD NUMBER         Patient presents with pain edema mild deformity to the left ankle status post open hole.  Discussed x-ray  findings with patient showing a mildly displaced distal fibular fracture.  Patient placed in a splint and given crutches to assist with ambulation.  Patient given discharge care instruction and advised follow-up with the orthopedic department listed in discharge care instructions.  Advised on drowsy effects of pain medications.     ____________________________________________   FINAL CLINICAL IMPRESSION(S) / ED DIAGNOSES  Final diagnoses:  Closed fracture of shaft of right fibula, unspecified fracture morphology, initial encounter     ED Discharge Orders          Ordered    oxyCODONE-acetaminophen (PERCOCET) 7.5-325 MG tablet  Every 6 hours PRN        06/11/21 1501    ibuprofen (ADVIL) 800 MG tablet  Every 8 hours PRN        06/11/21 1501             Note:  This document was prepared using Dragon voice recognition software and may include unintentional dictation errors.  Joni Reining, PA-C 06/11/21 1505    Minna Antis, MD 06/11/21 [phone]

## 2021-06-11 NOTE — ED Triage Notes (Signed)
Pt states that he and his brother were wrestling in the yard and he stepped in a hole with his left foot and pt has an obvious deformity to the left ankle

## 2021-06-11 NOTE — Discharge Instructions (Addendum)
Wear splint and ambulate with crutches until evaluation by orthopedics.  Be advised pain medication may cause drowsiness.  Do not operate a vehicle or machinery while taking pain medication.

## 2021-11-10 ENCOUNTER — Other Ambulatory Visit: Payer: Self-pay

## 2021-11-10 DIAGNOSIS — F10129 Alcohol abuse with intoxication, unspecified: Secondary | ICD-10-CM | POA: Insufficient documentation

## 2021-11-10 DIAGNOSIS — Z0279 Encounter for issue of other medical certificate: Secondary | ICD-10-CM | POA: Insufficient documentation
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
## 2021-11-11 ENCOUNTER — Emergency Department
Admission: EM | Admit: 2021-11-11 | Discharge: 2021-11-11 | Payer: Self-pay | Attending: Emergency Medicine | Admitting: Emergency Medicine

## 2021-11-11 DIAGNOSIS — Z008 Encounter for other general examination: Secondary | ICD-10-CM

## 2021-11-11 DIAGNOSIS — F10929 Alcohol use, unspecified with intoxication, unspecified: Secondary | ICD-10-CM
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
## 2021-11-11 NOTE — Discharge Instructions (Signed)
Mr. Gochez has been evaluated in the Emergency Department and is medically cleared for incarceration.  His vital signs are stable, he awakens easily and is in no distress, and there is no indication that he requires additional medical intervention at this time. ?

## 2023-01-16 IMAGING — DX DG ANKLE COMPLETE 3+V*L*
3 series · 3 of 3 positions shown · non-contrast
Comparison: None.

CLINICAL DATA: Pain and edema.  Injury.

EXAM:
LEFT ANKLE COMPLETE - 3+ VIEW

[ankle ap]
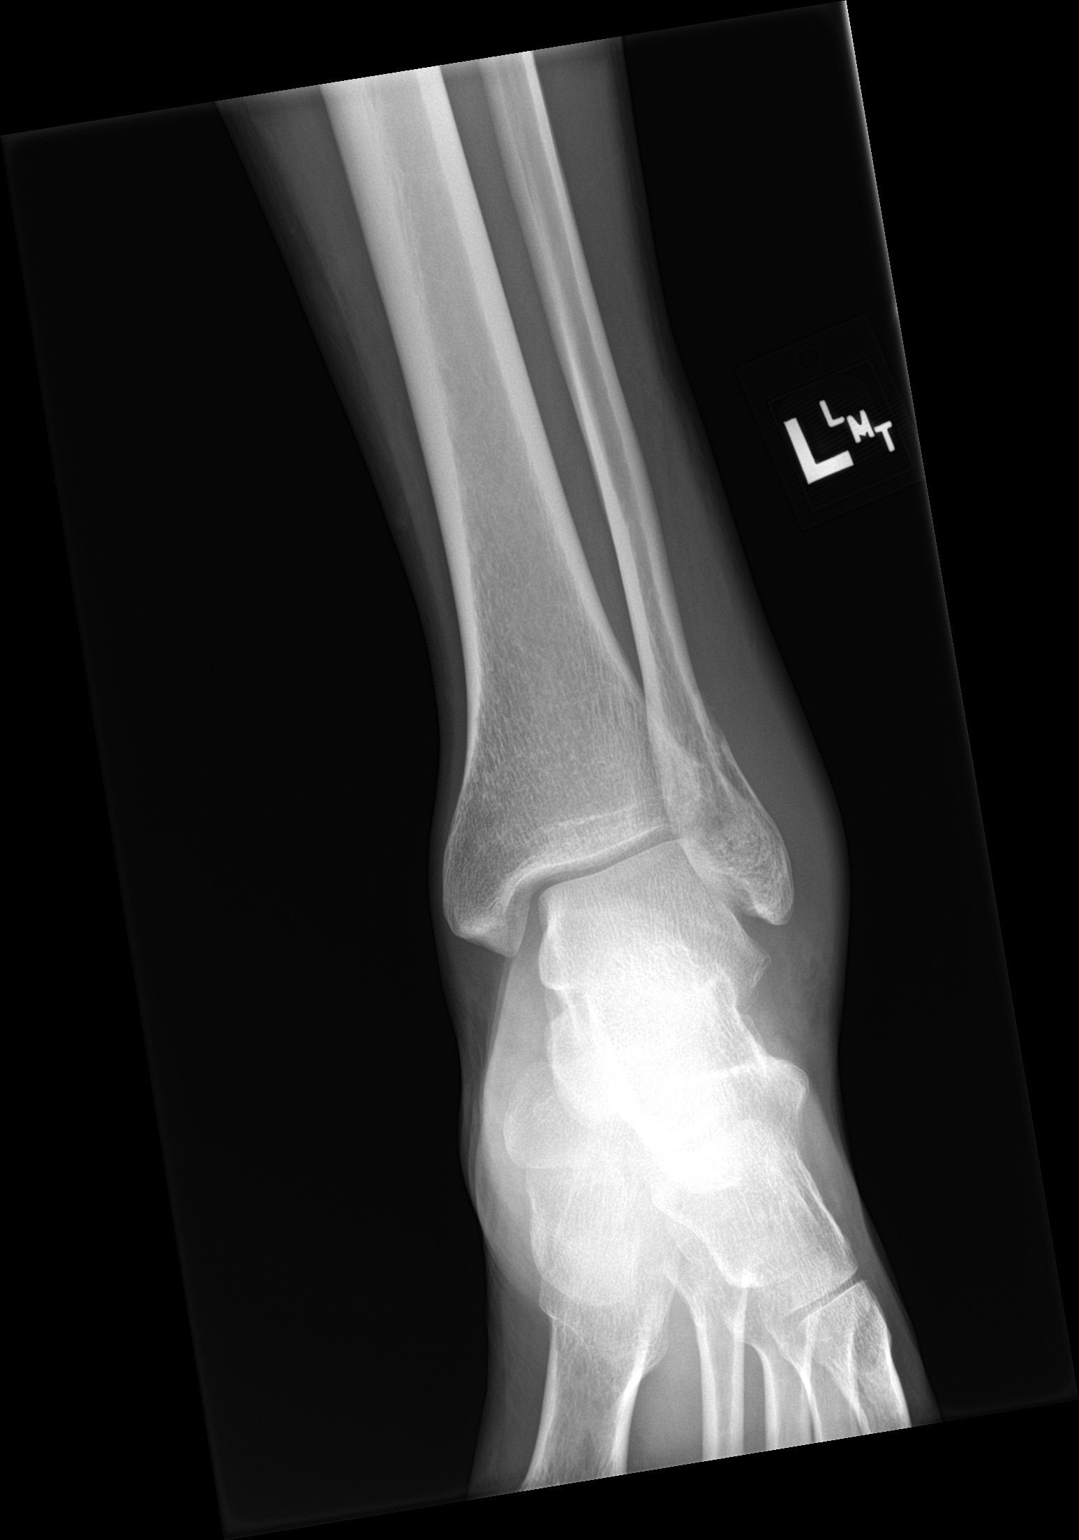

[ankle obl]
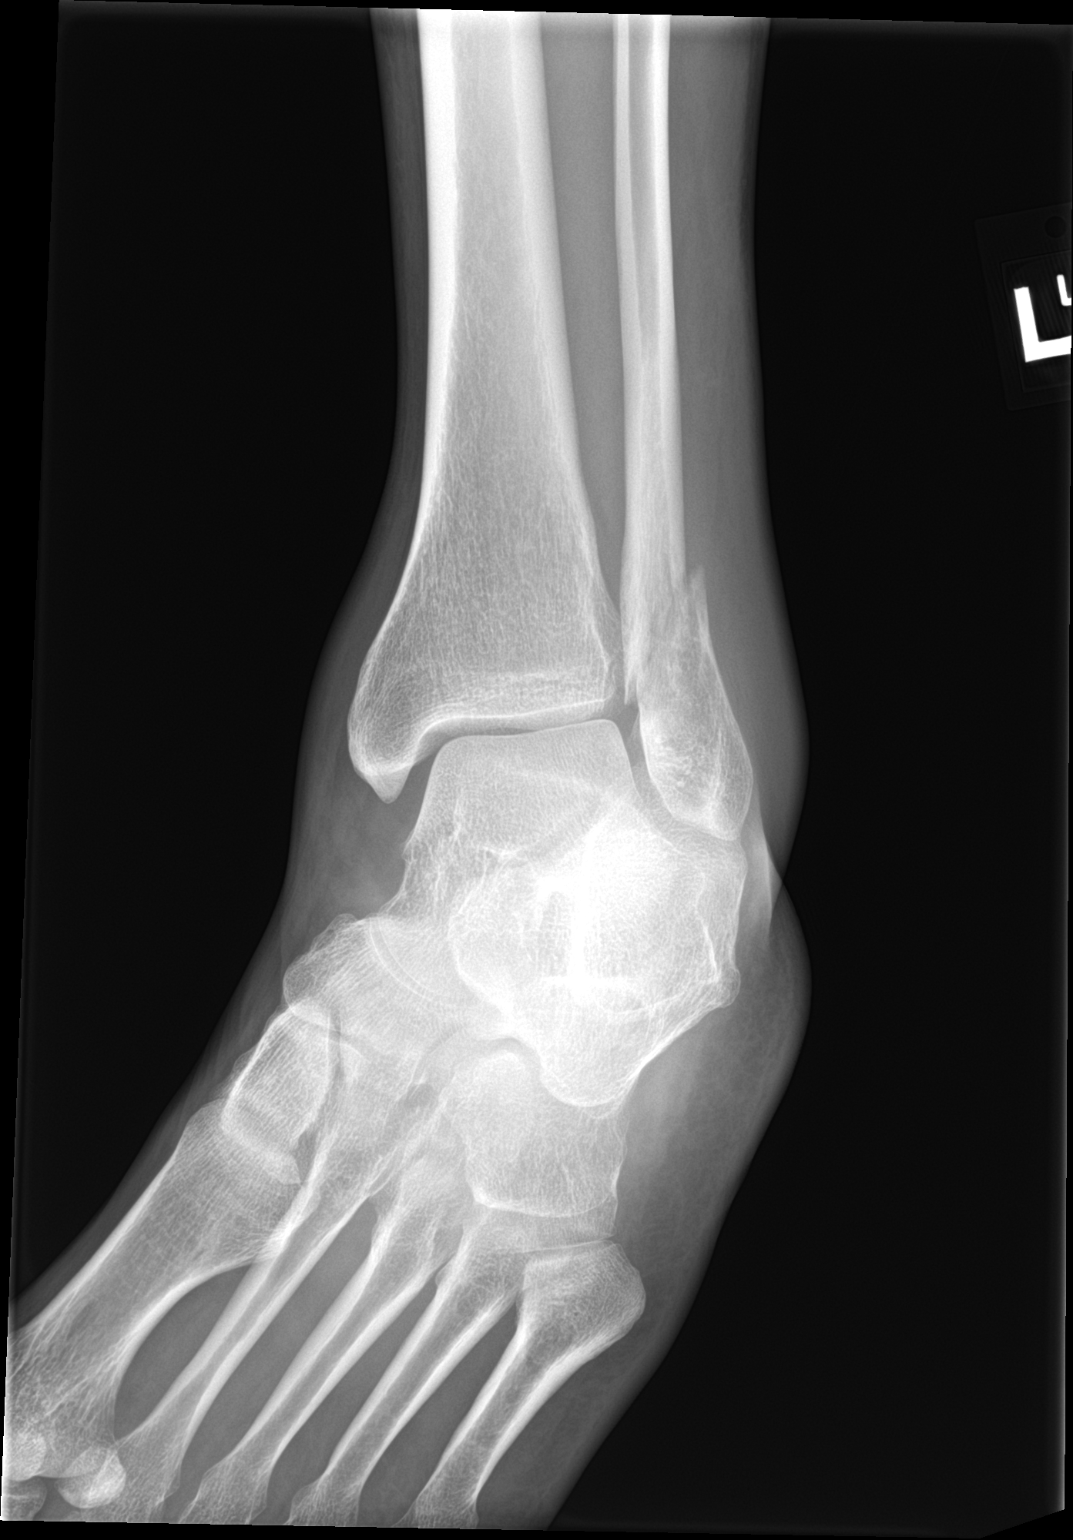

[ankle lat]
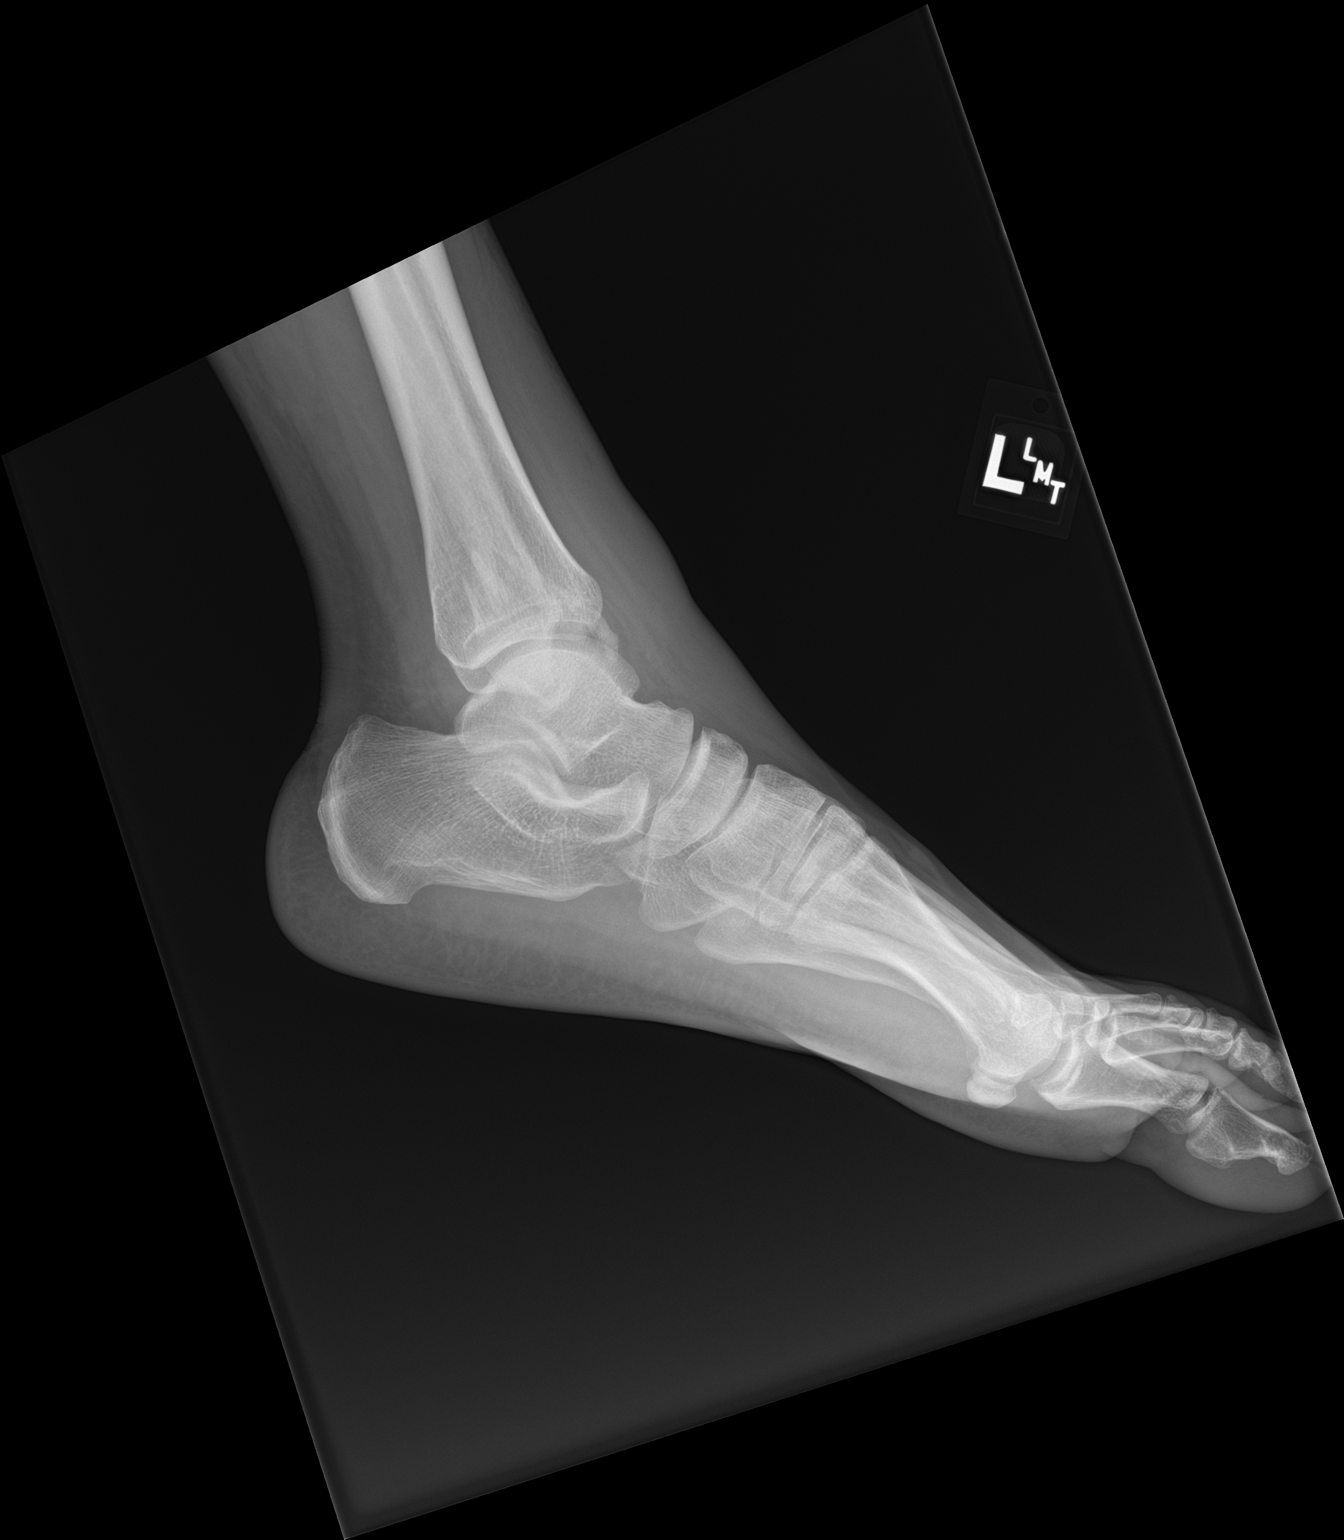

[3 of 3 positions shown; findings below may reference images not displayed]

FINDINGS: Mildly displaced fracture distal fibula. No fracture of the tibia.
Ankle mortise intact. Lateral soft tissue swelling.
IMPRESSION: Mildly displaced fracture distal fibula

## 2024-05-03 ENCOUNTER — Other Ambulatory Visit: Payer: Self-pay

## 2024-05-03 ENCOUNTER — Emergency Department
Admission: EM | Admit: 2024-05-03 | Discharge: 2024-05-03 | Disposition: A | Discharge status: Home / Self Care | Payer: MEDICAID | Attending: Emergency Medicine | Admitting: Emergency Medicine

## 2024-05-03 DIAGNOSIS — B349 Viral infection, unspecified: Secondary | ICD-10-CM | POA: Diagnosis not present

## 2024-05-03 DIAGNOSIS — R519 Headache, unspecified: Secondary | ICD-10-CM | POA: Diagnosis present

## 2024-05-03 LAB — COMPREHENSIVE METABOLIC PANEL WITH GFR
ALT: 13 U/L (ref 0–44)
AST: 18 U/L (ref 15–41)
Albumin: 4.1 g/dL (ref 3.5–5.0)
Alkaline Phosphatase: 44 U/L (ref 38–126)
Anion gap: 6 (ref 5–15)
BUN: 19 mg/dL (ref 6–20)
CO2: 28 mmol/L (ref 22–32)
Calcium: 9.2 mg/dL (ref 8.9–10.3)
Chloride: 105 mmol/L (ref 98–111)
Creatinine, Ser: 0.87 mg/dL (ref 0.61–1.24)
GFR, Estimated: >60 mL/min (ref >60)
Glucose, Bld: 108 mg/dL — ABNORMAL HIGH (ref 70–99)
Potassium: 3.8 mmol/L (ref 3.5–5.1)
Sodium: 139 mmol/L (ref 135–145)
Total Bilirubin: 0.7 mg/dL (ref 0.0–1.2)
Total Protein: 7.8 g/dL (ref 6.5–8.1)

## 2024-05-03 LAB — CBC
HCT: 42.3 % (ref 39.0–52.0)
Hemoglobin: 14.2 g/dL (ref 13.0–17.0)
MCH: 28.3 pg (ref 26.0–34.0)
MCHC: 33.6 g/dL (ref 30.0–36.0)
MCV: 84.3 fL (ref 80.0–100.0)
Platelets: 153 K/uL (ref 150–400)
RBC: 5.02 MIL/uL (ref 4.22–5.81)
RDW: 13.2 % (ref 11.5–15.5)
WBC: 5.7 K/uL (ref 4.0–10.5)
nRBC: 0 % (ref 0.0–0.2)

## 2024-05-03 LAB — SARS CORONAVIRUS 2 BY RT PCR: SARS Coronavirus 2 by RT PCR: NEGATIVE

## 2024-05-03 LAB — LIPASE, BLOOD: Lipase: 42 U/L (ref 11–51)

## 2024-05-03 NOTE — ED Triage Notes (Signed)
 C/O abdominal pain since Monday, body aches, and headaches since MOnday. Chills this morning.  Denies N/V  AAOx3. Skin warm and dry. NAD

## 2024-05-03 NOTE — Discharge Instructions (Signed)
 Follow-up with your primary care provider or [place] urgent care if any continued problems or concerns.  Increase fluids for the next 24 to 48 hours.  Ginger ale, Sprite, 7-Up, Gatorade, Jell-O are acceptable.  Avoid solid food for the next 48 hours which should decrease diarrhea but increase your urine output.  You may take Tylenol  as needed for muscle aches.  Return to the emergency department if any severe worsening of your symptoms.

## 2024-05-03 NOTE — ED Provider Notes (Signed)
 [facility] Provider Note    Event Date/Time   First MD Initiated Contact with Patient 05/03/24 [phone]     (approximate)   History   No chief complaint on file.   HPI  Eric Mcfarland is a 43 y.o. male   presents to the ED with complaint of abdominal pain that started on Monday along with bodyaches, headache, chills but denies nausea vomiting.  He does endorse some diarrhea today.  He is aware that he has been exposed to COVID twice by other family members.  Patient has a history of reflux and GERD and currently not taking any medications.      Physical Exam   Triage Vital Signs: ED Triage Vitals  Encounter Vitals Group     BP 05/03/24 0922 129/82     Girls Systolic BP Percentile --      Girls Diastolic BP Percentile --      Boys Systolic BP Percentile --      Boys Diastolic BP Percentile --      Pulse Rate 05/03/24 0922 77     Resp 05/03/24 0922 16     Temp 05/03/24 0922 97.6 F (36.4 C)     Temp src --      SpO2 05/03/24 0922 98 %     Weight 05/03/24 0921 139 lb 15.9 oz (63.5 kg)     Height --      Head Circumference --      Peak Flow --      Pain Score 05/03/24 0923 7     Pain Loc --      Pain Education --      Exclude from Growth Chart --     Most recent vital signs: Vitals:   05/03/24 0922  BP: 129/82  Pulse: 77  Resp: 16  Temp: 97.6 F (36.4 C)  SpO2: 98%     General: Awake, no distress.  CV:  Good peripheral perfusion.  Heart regular rate and rhythm. Resp:  Normal effort.  Lungs clear bilaterally. Abd:  No distention.  Soft, flat, minimal epigastric tenderness.  No referred or rebound tenderness.  Bowel sounds normoactive x 4 quadrants. Other:     ED Results / Procedures / Treatments   Labs (all labs ordered are listed, but only abnormal results are displayed) Labs Reviewed  COMPREHENSIVE METABOLIC PANEL WITH GFR - Abnormal; Notable for the following components:      Result Value   Glucose, Bld 108 (*)    All  other components within normal limits  SARS CORONAVIRUS 2 BY RT PCR  LIPASE, BLOOD  CBC      PROCEDURES:  Critical Care performed:   Procedures   MEDICATIONS ORDERED IN ED: Medications - No data to display   IMPRESSION / MDM / ASSESSMENT AND PLAN / ED COURSE  I reviewed the triage vital signs and the nursing notes.   Differential diagnosis includes, but is not limited to, COVID, viral illness, upper respiratory infection, viral gastroenteritis.  43 year old male presents to the ED with 4 days of illness including diarrhea.  He has been exposed to 2 members of his family had COVID.  COVID nasal swab in the ED was negative.  Lipase, CBC and CMP were all essentially benign.  Patient was made aware that most likely he has a viral illness.  He will continue to drink fluids.  He was given a note to return to work on Monday and continue with Tylenol  as needed for  body aches.      Patient's presentation is most consistent with acute complicated illness / injury requiring diagnostic workup.  FINAL CLINICAL IMPRESSION(S) / ED DIAGNOSES   Final diagnoses:  Viral illness     Rx / DC Orders   ED Discharge Orders     None        Note:  This document was prepared using Dragon voice recognition software and may include unintentional dictation errors.   Saunders Shona CROME, PA-C 05/03/24 1424    Arlander [place], MD 05/03/24 1447

## 2024-05-07 ENCOUNTER — Ambulatory Visit (INDEPENDENT_AMBULATORY_CARE_PROVIDER_SITE_OTHER): Payer: MEDICAID | Admitting: Cardiology

## 2024-05-07 ENCOUNTER — Ambulatory Visit
Admission: RE | Admit: 2024-05-07 | Discharge: 2024-05-07 | Disposition: A | Discharge status: Home / Self Care | Payer: MEDICAID | Source: Ambulatory Visit | Attending: Cardiology | Admitting: Cardiology

## 2024-05-07 ENCOUNTER — Encounter: Payer: Self-pay | Admitting: Cardiology

## 2024-05-07 ENCOUNTER — Ambulatory Visit
Admission: RE | Admit: 2024-05-07 | Discharge: 2024-05-07 | Disposition: A | Discharge status: Home / Self Care | Payer: MEDICAID | Attending: Cardiology | Admitting: Cardiology

## 2024-05-07 VITALS — BP 130/72 | HR 70 | Ht 65.0 in | Wt 139.6 lb

## 2024-05-07 DIAGNOSIS — Z131 Encounter for screening for diabetes mellitus: Secondary | ICD-10-CM

## 2024-05-07 DIAGNOSIS — M25511 Pain in right shoulder: Secondary | ICD-10-CM | POA: Insufficient documentation

## 2024-05-07 DIAGNOSIS — K259 Gastric ulcer, unspecified as acute or chronic, without hemorrhage or perforation: Secondary | ICD-10-CM | POA: Diagnosis not present

## 2024-05-07 DIAGNOSIS — R5383 Other fatigue: Secondary | ICD-10-CM | POA: Diagnosis not present

## 2024-05-07 DIAGNOSIS — Z13220 Encounter for screening for lipoid disorders: Secondary | ICD-10-CM

## 2024-05-07 DIAGNOSIS — R1013 Epigastric pain: Secondary | ICD-10-CM | POA: Insufficient documentation

## 2024-05-07 DIAGNOSIS — Z7689 Persons encountering health services in other specified circumstances: Secondary | ICD-10-CM | POA: Insufficient documentation

## 2024-05-07 DIAGNOSIS — Z1329 Encounter for screening for other suspected endocrine disorder: Secondary | ICD-10-CM

## 2024-05-07 DIAGNOSIS — Z0130 Encounter for examination of blood pressure without abnormal findings: Secondary | ICD-10-CM

## 2024-05-07 MED ORDER — OMEPRAZOLE 10 MG PO CPDR: 10.0000 mg | DELAYED_RELEASE_CAPSULE | Freq: Every day | ORAL | 11 refills | Status: AC

## 2024-05-07 NOTE — Progress Notes (Signed)
 New Patient Office Visit  Subjective   Patient ID: Eric Mcfarland, male    DOB: Mar 31, 1981  Age: 43 y.o. MRN: [id number]  CC:  Chief Complaint  Patient presents with   Establish Care    NPE    HPI Eric Mcfarland presents to establish care Previous Primary Care provider/office:   he does have additional concerns to discuss today.   Patient in office to establish care. Patient recently in the ED for a viral illness. Since then, patient has been experiencing epigastric abdominal pain. Patient reports history of stomach ulcers and acid reflux. Will restart omeprazole . Will send referral to GI for further evaluation.  Patient complaining of right shoulder pain. No imagine done. Will order an xray of the right shoulder to evaluate pain.  Patient complains of fatigue. States he frequently falls asleep while at work. Will fall asleep while watching television. Sleep score low. Will order blood work.     Outpatient Encounter Medications as of 05/07/2024  Medication Sig   omeprazole  (PRILOSEC) 10 MG capsule Take 1 capsule (10 mg total) by mouth daily.   [DISCONTINUED] omeprazole  (PRILOSEC) 10 MG capsule Take 1 capsule (10 mg total) by mouth daily. (Patient not taking: Reported on 05/07/2024)   No facility-administered encounter medications on file as of 05/07/2024.    Past Medical History:  Diagnosis Date   GERD (gastroesophageal reflux disease)    Pneumothorax on right 11/13/2020   Reflux     History reviewed. No pertinent surgical history.  History reviewed. No pertinent family history.  Social History   Socioeconomic History   Marital status: Single    Spouse name: Not on file   Number of children: Not on file   Years of education: Not on file   Highest education level: Not on file  Occupational History   Not on file  Tobacco Use   Smoking status: Every Day    Current packs/day: 1.00    Types: Cigarettes   Smokeless tobacco: Never  Vaping Use   Vaping  status: Never Used  Substance and Sexual Activity   Alcohol use: Yes   Drug use: Yes    Types: Marijuana    Comment: last smoked 4-5 days ago   Sexual activity: Not on file  Other Topics Concern   Not on file  Social History Narrative   Not on file   Social Drivers of Health   Financial Resource Strain: Not on file  Food Insecurity: Not on file  Transportation Needs: Not on file  Physical Activity: Not on file  Stress: Not on file  Social Connections: Not on file  Intimate Partner Violence: Not on file    Review of Systems  Constitutional: Negative.   HENT: Negative.    Eyes: Negative.   Respiratory: Negative.  Negative for shortness of breath.   Cardiovascular: Negative.  Negative for chest pain.  Gastrointestinal:  Positive for abdominal pain, heartburn and nausea. Negative for constipation and diarrhea.  Genitourinary: Negative.   Musculoskeletal:  Negative for joint pain and myalgias.  Skin: Negative.   Neurological: Negative.  Negative for dizziness and headaches.  Endo/Heme/Allergies: Negative.   All other systems reviewed and are negative.       Objective   BP 130/72   Pulse 70   Ht 5' 5 (1.651 m)   Wt 139 lb 9.6 oz (63.3 kg)   SpO2 99%   BMI 23.23 kg/m   Physical Exam Nursing note reviewed.  Constitutional:  Appearance: Normal appearance. He is normal weight.  HENT:     Head: Normocephalic and atraumatic.     Nose: Nose normal.     Mouth/Throat:     Mouth: Mucous membranes are moist.     Pharynx: Oropharynx is clear.  Eyes:     Extraocular Movements: Extraocular movements intact.     Conjunctiva/sclera: Conjunctivae normal.     Pupils: Pupils are equal, round, and reactive to light.  Cardiovascular:     Rate and Rhythm: Normal rate and regular rhythm.     Pulses: Normal pulses.     Heart sounds: Normal heart sounds.  Pulmonary:     Effort: Pulmonary effort is normal.     Breath sounds: Normal breath sounds.  Abdominal:     General:  Abdomen is flat. Bowel sounds are normal.     Palpations: Abdomen is soft.  Musculoskeletal:        General: Normal range of motion.     Cervical back: Normal range of motion.  Skin:    General: Skin is warm and dry.  Neurological:     General: No focal deficit present.     Mental Status: He is alert and oriented to person, place, and time.  Psychiatric:        Mood and Affect: Mood normal.        Behavior: Behavior normal.        Thought Content: Thought content normal.        Judgment: Judgment normal.       05/07/2024   12:42 PM  GAD 7 : Generalized Anxiety Score  Nervous, Anxious, on Edge 1  Control/stop worrying 0  Worry too much - different things 1  Trouble relaxing 0  Restless 1  Easily annoyed or irritable 1  Afraid - awful might happen 0  Total GAD 7 Score 4    Flowsheet Row Office Visit from 05/07/2024 in [facility]  Thoughts that you would be better off dead, or of hurting yourself in some way Not at all  PHQ-9 Total Score 4       Assessment & Plan:  Omeprazole  Referral to GI Shoulder xray Lab work today  Problem List Items Addressed This Visit       Digestive   Gastric ulcer   Relevant Orders   Ambulatory referral to Gastroenterology     Other   Encounter to establish care - Primary   Epigastric pain   Relevant Orders   Ambulatory referral to Gastroenterology   Other fatigue   Relevant Orders   CBC with Differential/Platelet   Vitamin D  (25 hydroxy)   Acute pain of right shoulder   Relevant Orders   DG Shoulder Left   Other Visit Diagnoses       Diabetes mellitus screening       Relevant Orders   Hemoglobin A1c   CMP14+EGFR     Thyroid disorder screening       Relevant Orders   TSH     Lipid screening       Relevant Orders   Lipid Profile       Return in about 4 weeks (around 06/04/2024).   Total time spent: 25 minutes  [employer], NP  05/07/2024   This document may have been prepared by Dragon  Voice Recognition software and as such may include unintentional dictation errors.

## 2024-05-08 ENCOUNTER — Ambulatory Visit: Payer: Self-pay | Admitting: Cardiology

## 2024-05-08 ENCOUNTER — Other Ambulatory Visit: Payer: Self-pay | Admitting: Cardiology

## 2024-05-08 LAB — CBC WITH DIFFERENTIAL/PLATELET
Basophils Absolute: 0.1 x10E3/uL (ref 0.0–0.2)
Basos: 1 %
EOS (ABSOLUTE): 0.1 x10E3/uL (ref 0.0–0.4)
Eos: 2 %
Hematocrit: 44.3 % (ref 37.5–51.0)
Hemoglobin: 14.6 g/dL (ref 13.0–17.7)
Immature Grans (Abs): 0 x10E3/uL (ref 0.0–0.1)
Immature Granulocytes: 0 %
Lymphocytes Absolute: 1.8 x10E3/uL (ref 0.7–3.1)
Lymphs: 28 %
MCH: 28.2 pg (ref 26.6–33.0)
MCHC: 33 g/dL (ref 31.5–35.7)
MCV: 86 fL (ref 79–97)
Monocytes Absolute: 0.6 x10E3/uL (ref 0.1–0.9)
Monocytes: 10 %
Neutrophils Absolute: 3.7 x10E3/uL (ref 1.4–7.0)
Neutrophils: 59 %
Platelets: 163 x10E3/uL (ref 150–450)
RBC: 5.17 x10E6/uL (ref 4.14–5.80)
RDW: 13 % (ref 11.6–15.4)
WBC: 6.3 x10E3/uL (ref 3.4–10.8)

## 2024-05-08 LAB — CMP14+EGFR
ALT: 13 IU/L (ref 0–44)
AST: 14 IU/L (ref 0–40)
Albumin: 4.6 g/dL (ref 4.1–5.1)
Alkaline Phosphatase: 60 IU/L (ref 47–123)
BUN/Creatinine Ratio: 16 (ref 9–20)
BUN: 15 mg/dL (ref 6–24)
Bilirubin Total: 0.4 mg/dL (ref 0.0–1.2)
CO2: 25 mmol/L (ref 20–29)
Calcium: 10.3 mg/dL — ABNORMAL HIGH (ref 8.7–10.2)
Chloride: 102 mmol/L (ref 96–106)
Creatinine, Ser: 0.95 mg/dL (ref 0.76–1.27)
Globulin, Total: 3.1 g/dL (ref 1.5–4.5)
Glucose: 65 mg/dL — ABNORMAL LOW (ref 70–99)
Potassium: 4.3 mmol/L (ref 3.5–5.2)
Sodium: 140 mmol/L (ref 134–144)
Total Protein: 7.7 g/dL (ref 6.0–8.5)
eGFR: 102 mL/min/1.73 (ref >59)

## 2024-05-08 LAB — LIPID PANEL
Chol/HDL Ratio: 3.4 ratio (ref 0.0–5.0)
Cholesterol, Total: 172 mg/dL (ref 100–199)
HDL: 50 mg/dL (ref >39)
LDL Chol Calc (NIH): 101 mg/dL — ABNORMAL HIGH (ref 0–99)
Triglycerides: 115 mg/dL (ref 0–149)
VLDL Cholesterol Cal: 21 mg/dL (ref 5–40)

## 2024-05-08 LAB — HEMOGLOBIN A1C
Est. average glucose Bld gHb Est-mCnc: 105 mg/dL
Hgb A1c MFr Bld: 5.3 % (ref 4.8–5.6)

## 2024-05-08 LAB — TSH: TSH: 1.21 u[IU]/mL (ref 0.450–4.500)

## 2024-05-08 LAB — VITAMIN D 25 HYDROXY (VIT D DEFICIENCY, FRACTURES): Vit D, 25-Hydroxy: 14 ng/mL — ABNORMAL LOW (ref 30.0–100.0)

## 2024-05-08 MED ORDER — VITAMIN D (ERGOCALCIFEROL) 1.25 MG (50000 UNIT) PO CAPS: 50000.0000 [IU] | ORAL_CAPSULE | ORAL | 6 refills | Status: AC

## 2024-05-09 NOTE — Progress Notes (Signed)
Pt informed

## 2024-05-24 ENCOUNTER — Emergency Department
Admission: EM | Admit: 2024-05-24 | Discharge: 2024-05-24 | Disposition: A | Discharge status: Home / Self Care | Payer: MEDICAID

## 2024-05-24 ENCOUNTER — Other Ambulatory Visit: Payer: Self-pay

## 2024-05-24 ENCOUNTER — Emergency Department: Payer: MEDICAID

## 2024-05-24 DIAGNOSIS — R55 Syncope and collapse: Secondary | ICD-10-CM | POA: Diagnosis not present

## 2024-05-24 DIAGNOSIS — R519 Headache, unspecified: Secondary | ICD-10-CM | POA: Diagnosis present

## 2024-05-24 LAB — CBC WITH DIFFERENTIAL/PLATELET
Abs Immature Granulocytes: 0.02 K/uL (ref 0.00–0.07)
Basophils Absolute: 0 K/uL (ref 0.0–0.1)
Basophils Relative: 1 %
Eosinophils Absolute: 0.1 K/uL (ref 0.0–0.5)
Eosinophils Relative: 2 %
HCT: 44 % (ref 39.0–52.0)
Hemoglobin: 14.9 g/dL (ref 13.0–17.0)
Immature Granulocytes: 0 %
Lymphocytes Relative: 29 %
Lymphs Abs: 1.8 K/uL (ref 0.7–4.0)
MCH: 28.3 pg (ref 26.0–34.0)
MCHC: 33.9 g/dL (ref 30.0–36.0)
MCV: 83.7 fL (ref 80.0–100.0)
Monocytes Absolute: 0.5 K/uL (ref 0.1–1.0)
Monocytes Relative: 8 %
Neutro Abs: 3.7 K/uL (ref 1.7–7.7)
Neutrophils Relative %: 60 %
Platelets: 149 K/uL — ABNORMAL LOW (ref 150–400)
RBC: 5.26 MIL/uL (ref 4.22–5.81)
RDW: 12.9 % (ref 11.5–15.5)
WBC: 6.1 K/uL (ref 4.0–10.5)
nRBC: 0 % (ref 0.0–0.2)

## 2024-05-24 LAB — PROTIME-INR
INR: 1.1 (ref 0.8–1.2)
Prothrombin Time: 14.8 s (ref 11.4–15.2)

## 2024-05-24 LAB — RESP PANEL BY RT-PCR (RSV, FLU A&B, COVID)  RVPGX2
Influenza A by PCR: NEGATIVE
Influenza B by PCR: NEGATIVE
Resp Syncytial Virus by PCR: NEGATIVE
SARS Coronavirus 2 by RT PCR: NEGATIVE

## 2024-05-24 LAB — BASIC METABOLIC PANEL WITH GFR
Anion gap: 9 (ref 5–15)
BUN: 16 mg/dL (ref 6–20)
CO2: 29 mmol/L (ref 22–32)
Calcium: 9.4 mg/dL (ref 8.9–10.3)
Chloride: 101 mmol/L (ref 98–111)
Creatinine, Ser: 0.95 mg/dL (ref 0.61–1.24)
GFR, Estimated: >60 mL/min (ref >60)
Glucose, Bld: 102 mg/dL — ABNORMAL HIGH (ref 70–99)
Potassium: 4.1 mmol/L (ref 3.5–5.1)
Sodium: 139 mmol/L (ref 135–145)

## 2024-05-24 LAB — TROPONIN I (HIGH SENSITIVITY): Troponin I (High Sensitivity): <2 ng/L (ref <18)

## 2024-05-24 LAB — D-DIMER, QUANTITATIVE: D-Dimer, Quant: <0.27 ug{FEU}/mL (ref 0.00–0.50)

## 2024-05-24 MED ORDER — SODIUM CHLORIDE 0.9 % IV BOLUS
1000.0000 mL | Freq: Once | INTRAVENOUS | Status: AC
  Administered 2024-05-24: 1000 mL via INTRAVENOUS

## 2024-05-24 MED ORDER — DIPHENHYDRAMINE HCL 50 MG/ML IJ SOLN
25.0000 mg | Freq: Once | INTRAMUSCULAR | Status: AC
  Administered 2024-05-24: 25 mg via INTRAVENOUS
  Filled 2024-05-24: qty 1

## 2024-05-24 MED ORDER — METOCLOPRAMIDE HCL 5 MG/ML IJ SOLN
10.0000 mg | Freq: Once | INTRAMUSCULAR | Status: AC
  Administered 2024-05-24: 10 mg via INTRAVENOUS
  Filled 2024-05-24: qty 2

## 2024-05-24 MED ORDER — IOHEXOL 350 MG/ML SOLN
75.0000 mL | Freq: Once | INTRAVENOUS | Status: AC | PRN
  Administered 2024-05-24: 75 mL via INTRAVENOUS

## 2024-05-24 MED ORDER — ACETAMINOPHEN 500 MG PO TABS
1000.0000 mg | ORAL_TABLET | Freq: Once | ORAL | Status: AC
  Administered 2024-05-24: 1000 mg via ORAL
  Filled 2024-05-24: qty 2

## 2024-05-24 NOTE — Discharge Instructions (Signed)
 Your evaluation in the emergency department was overall reassuring.  You can use Tylenol  and Motrin  as needed for any recurrent headache, I recommend drinking plenty of water to help with further episodes of lightheadedness in the future.  Please do follow-up closely with your primary care provider for reevaluation, and return to the emergency department with any new or worsening symptoms.

## 2024-05-24 NOTE — ED Provider Notes (Signed)
 [facility] Provider Note    Event Date/Time   First MD Initiated Contact with Patient 05/24/24 1000     (approximate)   History   Headache, Dizziness, and Loss of Consciousness  Pt to ED for c/o headaches and dizziness for a couple days. Pt states he blacked out last night, states he fell, but caught himself. Denies hitting head. Pt A&O, ambulatory to triage.   HPI Eric Mcfarland is a 43 y.o. male PMH gastric ulcer, prior traumatic pneumothorax presents for evaluation of headache, syncope - Patient has been having a severe headache over the past 2 days or so.  Was originally gradual onset though then proceeded to severe headache yesterday.  No preceding trauma.  Not on blood thinners.  Notes he has been feeling somewhat lightheaded.  No significant neck pain or stiffness.  No rashes.  No clear fevers. -Says that yesterday evening his uncle came to visit him, he was lying in bed, sat up, felt lightheaded, walked to the door, apparently open the door but then lost consciousness and partially fell backward onto his dog kennel.  Was unconscious for about 1 minute per his uncle who then helped him back to his bed.  Decided to come to emergency department today for eval.  Does have some ongoing headache, currently 5/10. -No history of DVT/PE, no recent surgery/stasis/travel, no leg swelling, no chest pain, no hormone use     Physical Exam   Triage Vital Signs: ED Triage Vitals  Encounter Vitals Group     BP 05/24/24 0949 (!) 142/96     Girls Systolic BP Percentile --      Girls Diastolic BP Percentile --      Boys Systolic BP Percentile --      Boys Diastolic BP Percentile --      Pulse Rate 05/24/24 0949 71     Resp 05/24/24 0949 16     Temp 05/24/24 0949 97.8 F (36.6 C)     Temp Source 05/24/24 0949 Oral     SpO2 05/24/24 0949 98 %     Weight 05/24/24 0953 140 lb (63.5 kg)     Height 05/24/24 0953 5' 5 (1.651 m)     Head Circumference --       Peak Flow --      Pain Score 05/24/24 0950 5     Pain Loc --      Pain Education --      Exclude from Growth Chart --     Most recent vital signs: Vitals:   05/24/24 1230 05/24/24 1400  BP: 110/71 111/73  Pulse: 73 67  Resp: 14 15  Temp:    SpO2: 98% 97%     General: Awake, no distress.  HEENT: Normocephalic, atraumatic CV:  Good peripheral perfusion. RRR, RP 2+ Resp:  Normal effort. CTAB Abd:  No distention. Nontender to deep palpation throughout Neuro:  Face symmetric, moving all extremities spontaneously, AO x 4, no focal motor deficit appreciated   ED Results / Procedures / Treatments   Labs (all labs ordered are listed, but only abnormal results are displayed) Labs Reviewed  CBC WITH DIFFERENTIAL/PLATELET - Abnormal; Notable for the following components:      Result Value   Platelets 149 (*)    All other components within normal limits  BASIC METABOLIC PANEL WITH GFR - Abnormal; Notable for the following components:   Glucose, Bld 102 (*)    All other components within normal limits  RESP PANEL BY RT-PCR (RSV, FLU A&B, COVID)  RVPGX2  PROTIME-INR  D-DIMER, QUANTITATIVE  TROPONIN I (HIGH SENSITIVITY)  TROPONIN I (HIGH SENSITIVITY)     EKG  Ecg = sinus rhythm, rate 70, no gross ST elevation or depression, no significant repolarization abnormality, normal axis, normal intervals.  No evidence of ischemia nor arrhythmia on my interpretation.   RADIOLOGY Radiology interpreted by myself and radiology report reviewed.  No acute pathology identified.    PROCEDURES:  Critical Care performed: No  Procedures   MEDICATIONS ORDERED IN ED: Medications  acetaminophen  (TYLENOL ) tablet 1,000 mg (1,000 mg Oral Given 05/24/24 1019)  sodium chloride  0.9 % bolus 1,000 mL (0 mLs Intravenous Stopped 05/24/24 1207)  metoCLOPramide (REGLAN) injection 10 mg (10 mg Intravenous Given 05/24/24 1022)  diphenhydrAMINE  (BENADRYL ) injection 25 mg (25 mg Intravenous Given 05/24/24  1024)  iohexol  (OMNIPAQUE ) 350 MG/ML injection 75 mL (75 mLs Intravenous Contrast Given 05/24/24 1054)     IMPRESSION / MDM / ASSESSMENT AND PLAN / ED COURSE  I reviewed the triage vital signs and the nursing notes.                              DDX/MDM/AP: Differential diagnosis includes, but is not limited to, benign headache type including migraine or tension, consider underlying intracranial hemorrhage though story is gradual onset/reassuring and headache has improved--given new headache type and severity will get screening CT head.  Given timeframe of symptoms, will do CTA to ensure no underlying aneurysm.  Do not clinically suspect meningitis/encephalitis at this time.  Consider viral syndrome including influenza or COVID-19 contributing to lightheadedness and headache.  Considered but doubt cardiac arrhythmia or ACS or PE.    Plan: - Labs - CTA head - IV fluid, Tylenol , Reglan, Benadryl  - EKG - Cardiac monitor - reassess  Patient's presentation is most consistent with acute presentation with potential threat to life or bodily function.  The patient is on the cardiac monitor to evaluate for evidence of arrhythmia and/or significant heart rate changes.  ED course below.  Workup unremarkable including CTA head.  Headache resolved with treatment.  No concerning findings regarding syncopal episode, overall suspect likely orthostatic episode given it was shortly after he stood up from laying down in bed.  Plan for PMD follow-up.  ED return precautions in place.  Patient agrees with plan.  Clinical Course as of 05/24/24 1449  Fri May 24, 2024  1011 CBC reviewed, unremarkable [MM]  1037 BMP reviewed, unremarkable [MM]  1122 Dimer wnl Viral swab neg [MM]  1209 CTA head: IMPRESSION: 1. No acute intracranial hemorrhage or ischemic change. 2. No evidence of significant stenosis, aneurysmal dilatation, or dissection involving the arteries of the head.   [MM]  1209 Trop wnl [MM]  1445  Patient reevaluated, feeling better after migraine cocktail here and is been able to sleep.  Reassured by unremarkable workup here today.  No clear evidence of acute pathology at this time.  Suspect likely.  Orthostatic episode yesterday.  Fortunately no recurrence of symptoms and unremarkable workup today.  Stable for outpatient follow-up.  Plan for PMD follow-up.  ED return precautions placed.  Patient agrees with plan. [MM]    Clinical Course User Index [MM] Clarine Ozell LABOR, MD     FINAL CLINICAL IMPRESSION(S) / ED DIAGNOSES   Final diagnoses:  Nonintractable headache, unspecified chronicity pattern, unspecified headache type  Syncope, unspecified syncope type     Rx /  DC Orders   ED Discharge Orders     None        Note:  This document was prepared using Dragon voice recognition software and may include unintentional dictation errors.   Clarine Ozell LABOR, MD 05/24/24 [id number]

## 2024-05-24 NOTE — ED Triage Notes (Signed)
 Pt to ED for c/o headaches and dizziness for a couple days. Pt states he blacked out last night, states he fell, but caught himself. Denies hitting head. Pt A&O, ambulatory to triage.

## 2024-05-24 NOTE — ED Notes (Signed)
 Per MD Mian ok to hold off on 2nd troponin at this time.

## 2024-07-02 ENCOUNTER — Ambulatory Visit (INDEPENDENT_AMBULATORY_CARE_PROVIDER_SITE_OTHER): Payer: MEDICAID | Admitting: Family Medicine

## 2024-07-02 ENCOUNTER — Encounter: Payer: Self-pay | Admitting: Family Medicine

## 2024-07-02 VITALS — BP 142/91 | HR 80 | Temp 97.9°F | Ht 65.0 in | Wt 148.2 lb

## 2024-07-02 DIAGNOSIS — R1013 Epigastric pain: Secondary | ICD-10-CM | POA: Diagnosis not present

## 2024-07-02 DIAGNOSIS — Z8711 Personal history of peptic ulcer disease: Secondary | ICD-10-CM | POA: Diagnosis not present

## 2024-07-02 DIAGNOSIS — K219 Gastro-esophageal reflux disease without esophagitis: Secondary | ICD-10-CM | POA: Diagnosis not present

## 2024-07-02 NOTE — Progress Notes (Signed)
 07/02/2024 Eric Mcfarland [id number] May 15, 1981  Gastroenterology Office Note    Referring Provider: Carin Gauze, NP Primary Care Physician:  Carin Gauze, NP  Primary GI Provider: Jinny Carmine, MD    Chief Complaint   Chief Complaint  Patient presents with   New Patient (Initial Visit)    HX gastric ulcer- taking omeprazole  x 1 month-much improvement-     History of Present Illness   Eric Mcfarland is a 43 y.o. male with PMHX of gastric ulcers presenting today at the request of Scoggins, Amber, NP due to epigastric pain.   05/03/2024 seen at ER for abdominal pain.   Patient reports he has history of stomach ulcers. States that if he eats pizza or spaghetti he will have acid reflux and epigastric pain.  Reports he was on a PPI but then stopped it for a while because he thought everything was fine.  He started back having bad reflux and epigastric pain, the pain would wake him up at night with some foamy vomiting. He was prescribed omeprazole  when he establish care with new PCP on 05/07/2024 and symptoms have improved.  Denies nausea and vomiting currently.  Reports that he had several black tarry stools prior to going to the emergency room.Endorses occasional NSAID use.  He drinks tea and juice daily, some sodas.  Patient reports he decreased significantly how much alcohol he drinks, only has a beer occasionally.  He smokes half pack of cigarettes a day, he stopped smoking marijuana 5 months ago.    Past Medical History:  Diagnosis Date   GERD (gastroesophageal reflux disease)    Pneumothorax on right 11/13/2020   Reflux     History reviewed. No pertinent surgical history.  Current Outpatient Medications  Medication Sig Dispense Refill   omeprazole  (PRILOSEC) 10 MG capsule Take 1 capsule (10 mg total) by mouth daily. 30 capsule 11   Vitamin D , Ergocalciferol , (DRISDOL ) 1.25 MG (50000 UNIT) CAPS capsule Take 1 capsule (50,000 Units total) by mouth every 7  (seven) days. 5 capsule 6   No current facility-administered medications for this visit.    Allergies as of 07/02/2024 - Review Complete 07/02/2024  Allergen Reaction Noted   Penicillins Swelling 02/17/2015    Family History  Problem Relation Age of Onset   Heart disease Father     Social History   Socioeconomic History   Marital status: Single    Spouse name: Not on file   Number of children: Not on file   Years of education: Not on file   Highest education level: Not on file  Occupational History   Not on file  Tobacco Use   Smoking status: Every Day    Current packs/day: 1.00    Types: Cigarettes   Smokeless tobacco: Never  Vaping Use   Vaping status: Never Used  Substance and Sexual Activity   Alcohol use: Yes    Comment: occasionally   Drug use: Yes    Comment: last smoked 4-5 days ago   Sexual activity: Not on file  Other Topics Concern   Not on file  Social History Narrative   Not on file   Social Drivers of Health   Financial Resource Strain: Not on file  Food Insecurity: Not on file  Transportation Needs: Not on file  Physical Activity: Not on file  Stress: Not on file  Social Connections: Not on file  Intimate Partner Violence: Not on file     RELEVANT GI HISTORY, IMAGING AND LABS:  CBC    Component Value Date/Time   WBC 6.1 05/24/2024 1001   RBC 5.26 05/24/2024 1001   HGB 14.9 05/24/2024 1001   HGB 14.6 05/07/2024 1209   HCT 44.0 05/24/2024 1001   HCT 44.3 05/07/2024 1209   PLT 149 (L) 05/24/2024 1001   PLT 163 05/07/2024 1209   MCV 83.7 05/24/2024 1001   MCV 86 05/07/2024 1209   MCV 84 03/28/2013 0615   MCH 28.3 05/24/2024 1001   MCHC 33.9 05/24/2024 1001   RDW 12.9 05/24/2024 1001   RDW 13.0 05/07/2024 1209   RDW 13.3 03/28/2013 0615   LYMPHSABS 1.8 05/24/2024 1001   LYMPHSABS 1.8 05/07/2024 1209   LYMPHSABS 2.1 03/28/2013 0615   MONOABS 0.5 05/24/2024 1001   MONOABS 0.7 03/28/2013 0615   EOSABS 0.1 05/24/2024 1001   EOSABS  0.1 05/07/2024 1209   EOSABS 0.1 03/28/2013 0615   BASOSABS 0.0 05/24/2024 1001   BASOSABS 0.1 05/07/2024 1209   BASOSABS 0.1 03/28/2013 0615   Recent Labs    05/03/24 0924 05/07/24 1209 05/24/24 1001  HGB 14.2 14.6 14.9    CMP     Component Value Date/Time   NA 139 05/24/2024 1001   NA 140 05/07/2024 1209   NA 138 03/28/2013 0615   K 4.1 05/24/2024 1001   K 3.8 03/28/2013 0615   CL 101 05/24/2024 1001   CL 107 03/28/2013 0615   CO2 29 05/24/2024 1001   CO2 27 03/28/2013 0615   GLUCOSE 102 (H) 05/24/2024 1001   GLUCOSE 123 (H) 03/28/2013 0615   BUN 16 05/24/2024 1001   BUN 15 05/07/2024 1209   BUN 11 03/28/2013 0615   CREATININE 0.95 05/24/2024 1001   CREATININE 0.98 03/28/2013 0615   CALCIUM 9.4 05/24/2024 1001   CALCIUM 8.4 (L) 03/28/2013 0615   PROT 7.7 05/07/2024 1209   PROT 8.4 (H) 03/27/2013 1458   ALBUMIN 4.6 05/07/2024 1209   ALBUMIN 4.1 03/27/2013 1458   AST 14 05/07/2024 1209   AST 18 03/27/2013 1458   ALT 13 05/07/2024 1209   ALT 16 03/27/2013 1458   ALKPHOS 60 05/07/2024 1209   ALKPHOS 58 03/27/2013 1458   BILITOT 0.4 05/07/2024 1209   BILITOT 0.9 03/27/2013 1458   GFRNONAA >60 05/24/2024 1001   GFRNONAA >60 03/28/2013 0615   GFRAA >60 03/28/2013 0615      Latest Ref Rng & Units 05/07/2024   12:09 PM 05/03/2024    9:24 AM 09/27/2020   10:19 AM  Hepatic Function  Total Protein 6.0 - 8.5 g/dL 7.7  7.8  8.4   Albumin 4.1 - 5.1 g/dL 4.6  4.1  4.6   AST 0 - 40 IU/L 14  18  36   ALT 0 - 44 IU/L 13  13  18    Alk Phosphatase 47 - 123 IU/L 60  44  59   Total Bilirubin 0.0 - 1.2 mg/dL 0.4  0.7  1.0       Review of Systems   All systems reviewed and negative except where noted in HPI.    Physical Exam  BP (!) 142/91   Pulse 80   Temp 97.9 F (36.6 C)   Ht 5' 5 (1.651 m)   Wt 148 lb 3.2 oz (67.2 kg)   SpO2 97%   BMI 24.66 kg/m  No LMP for male patient. General:   Alert and oriented. Pleasant and cooperative. Well-nourished and  well-developed. NAD. Head:  Normocephalic and atraumatic. Eyes:  Without icterus  Ears:  Normal auditory acuity. Lungs:  Respirations even and unlabored.  Clear throughout to auscultation.   No wheezes, crackles, or rhonchi. No acute distress. Heart:  Regular rate and rhythm; no murmurs, clicks, rubs, or gallops. Abdomen:  Normal bowel sounds.  No bruits.  Soft, non-tender and non-distended without masses, hepatosplenomegaly or hernias noted.  No guarding or rebound tenderness.    Rectal:  Deferred. Msk:  Symmetrical without gross deformities. Normal posture. Extremities:  Without edema. Neurologic:  Alert and  oriented x4;  grossly normal neurologically. Skin:  Intact without significant lesions or rashes. Psych:  Alert and cooperative. Normal mood and affect.   Assessment & Plan   Eric Mcfarland is a 43 y.o. male presenting today with epigastric pain and GERD.  Epigastric pain with associated acid reflux. Hx of gastric ulcers. Had episodes of black stools 2 months ago. - Lifestyle changes discussed, avoid NSAIDS, ETOH - Encouraged smoking cessation  - Continue omeprazole  30 min prior to meals - Schedule EGD to evaluate GERD, PUD. I discussed risks of EGD with patient today, including risk of sedation, bleeding or perforation. Patient provides understanding and gave verbal consent to proceed.    Grayce Bohr, DNP, AGNP-C [facility]

## 2024-07-04 ENCOUNTER — Ambulatory Visit: Payer: MEDICAID | Admitting: Cardiology

## 2024-07-05 ENCOUNTER — Ambulatory Visit: Payer: MEDICAID | Admitting: Cardiology

## 2024-09-10 ENCOUNTER — Encounter: Admission: RE | Payer: Self-pay | Source: Home / Self Care

## 2024-09-10 ENCOUNTER — Ambulatory Visit: Admission: RE | Admit: 2024-09-10 | Payer: MEDICAID | Source: Home / Self Care | Admitting: Gastroenterology

## 2024-09-10 SURGERY — EGD (ESOPHAGOGASTRODUODENOSCOPY)
Anesthesia: General

## 2024-10-02 ENCOUNTER — Ambulatory Visit: Payer: MEDICAID | Admitting: Family Medicine
